# Patient Record
Sex: Female | Born: 1965
Health system: Southern US, Community
[De-identification: ages and names within clinical notes are randomized; demographics above are authoritative.]

## PROBLEM LIST (undated history)

## (undated) DIAGNOSIS — M51369 Other intervertebral disc degeneration, lumbar region without mention of lumbar back pain or lower extremity pain: Secondary | ICD-10-CM

## (undated) DIAGNOSIS — M5136 Other intervertebral disc degeneration, lumbar region: Secondary | ICD-10-CM

## (undated) DIAGNOSIS — Z800 Family history of malignant neoplasm of digestive organs: Secondary | ICD-10-CM

## (undated) DIAGNOSIS — Z8049 Family history of malignant neoplasm of other genital organs: Secondary | ICD-10-CM

## (undated) DIAGNOSIS — C20 Malignant neoplasm of rectum: Secondary | ICD-10-CM

## (undated) HISTORY — DX: Family history of malignant neoplasm of digestive organs: Z80.0

## (undated) HISTORY — PX: COLON SURGERY: SHX602

## (undated) HISTORY — DX: Other intervertebral disc degeneration, lumbar region: M51.36

## (undated) HISTORY — DX: Other intervertebral disc degeneration, lumbar region without mention of lumbar back pain or lower extremity pain: M51.369

## (undated) HISTORY — DX: Family history of malignant neoplasm of other genital organs: Z80.49

## (undated) HISTORY — DX: Malignant neoplasm of rectum: C20

---
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
## 2018-11-02 ENCOUNTER — Ambulatory Visit (INDEPENDENT_AMBULATORY_CARE_PROVIDER_SITE_OTHER): Payer: 59 | Admitting: Family Medicine

## 2018-11-02 ENCOUNTER — Other Ambulatory Visit: Payer: Self-pay

## 2018-11-02 ENCOUNTER — Encounter: Payer: Self-pay | Admitting: Family Medicine

## 2018-11-02 VITALS — BP 90/64 | HR 70 | Ht 68.0 in | Wt 133.2 lb

## 2018-11-02 DIAGNOSIS — R1032 Left lower quadrant pain: Secondary | ICD-10-CM | POA: Diagnosis not present

## 2018-11-02 DIAGNOSIS — K625 Hemorrhage of anus and rectum: Secondary | ICD-10-CM | POA: Diagnosis not present

## 2018-11-02 DIAGNOSIS — K921 Melena: Secondary | ICD-10-CM

## 2018-11-02 DIAGNOSIS — Z1211 Encounter for screening for malignant neoplasm of colon: Secondary | ICD-10-CM

## 2018-11-02 DIAGNOSIS — Z7689 Persons encountering health services in other specified circumstances: Secondary | ICD-10-CM

## 2018-11-02 DIAGNOSIS — R1084 Generalized abdominal pain: Secondary | ICD-10-CM

## 2018-11-02 LAB — HEMOCCULT GUIAC POC 1CARD (OFFICE): Fecal Occult Blood, POC: NEGATIVE

## 2018-11-02 LAB — POCT HEMOGLOBIN: Hemoglobin: 13.4 g/dL (ref 11–14.6)

## 2018-11-02 MED ORDER — POLYETHYLENE GLYCOL 3350 17 GM/SCOOP PO POWD: 17.0000 g | Freq: Two times a day (BID) | ORAL | 1 refills | Status: AC | PRN

## 2018-11-02 MED ORDER — HYDROCORTISONE (PERIANAL) 2.5 % EX CREA: 1.0000 "application " | TOPICAL_CREAM | Freq: Two times a day (BID) | CUTANEOUS | 0 refills | Status: DC

## 2018-11-02 MED FILL — PROCTOZONE-HC 2.5 % CREA: 2.5 | 20 days supply | Qty: 30 | Fill #0

## 2018-11-02 NOTE — Patient Instructions (Addendum)
Thank you for coming to see me today. It was a pleasure! Today we talked about:   For your bleeding and constipation I recommend that you follow-up with a gastroenterologist in order to have a colonoscopy.  You are overdue for screening.  I also recommend that you take MiraLAX by starting twice daily until you have regular soft bowel movements.  You should not be straining to go to the bathroom.  Please be sure to increase the amount of fiber in your diet and also be drinking plenty of water. You may try doing sits baths where you fill up the bath with a small amount of water in order to help relieve the pain.  You may use over-the-counter cream such as Preparation H.  I have sent in a steroid cream that can also help calm down the area that is called Anusol cream.  You may use this twice a day.   Please follow-up in 6 weeks if you have not been able to be seen by GI, otherwise follow-up for your Pap smear after 1 year.  If you have any questions or concerns, please do not hesitate to call the office at [phone].  Take Care,   Martinique Delcie Ruppert, DO  Hemorrhoids Hemorrhoids are swollen veins that may develop:  In the butt (rectum). These are called internal hemorrhoids.  Around the opening of the butt (anus). These are called external hemorrhoids. Hemorrhoids can cause pain, itching, or bleeding. Most of the time, they do not cause serious problems. They usually get better with diet changes, lifestyle changes, and other home treatments. What are the causes? This condition may be caused by:  Having trouble pooping (constipation).  Pushing hard (straining) to poop.  Watery poop (diarrhea).  Pregnancy.  Being very overweight (obese).  Sitting for long periods of time.  Heavy lifting or other activity that causes you to strain.  Anal sex.  Riding a bike for a long period of time. What are the signs or symptoms? Symptoms of this condition include:  Pain.  Itching or soreness  in the butt.  Bleeding from the butt.  Leaking poop.  Swelling in the area.  One or more lumps around the opening of your butt. How is this diagnosed? A doctor can often diagnose this condition by looking at the affected area. The doctor may also:  Do an exam that involves feeling the area with a gloved hand (digital rectal exam).  Examine the area inside your butt using a small tube (anoscope).  Order blood tests. This may be done if you have lost a lot of blood.  Have you get a test that involves looking inside the colon using a flexible tube with a camera on the end (sigmoidoscopy or colonoscopy). How is this treated? This condition can usually be treated at home. Your doctor may tell you to change what you eat, make lifestyle changes, or try home treatments. If these do not help, procedures can be done to remove the hemorrhoids or make them smaller. These may involve:  Placing rubber bands at the base of the hemorrhoids to cut off their blood supply.  Injecting medicine into the hemorrhoids to shrink them.  Shining a type of light energy onto the hemorrhoids to cause them to fall off.  Doing surgery to remove the hemorrhoids or cut off their blood supply. Follow these instructions at home: Eating and drinking  Eat foods that have a lot of fiber in them. These include whole grains, beans, nuts, fruits,  and vegetables.  Ask your doctor about taking products that have added fiber (fibersupplements).  Reduce the amount of fat in your diet. You can do this by: ? Eating low-fat dairy products. ? Eating less red meat. ? Avoiding processed foods.  Drink enough fluid to keep your pee (urine) pale yellow. Managing pain and swelling   Take a warm-water bath (sitz bath) for 20 minutes to ease pain. Do this 3-4 times a day. You may do this in a bathtub or using a portable sitz bath that fits over the toilet.  If told, put ice on the painful area. It may be helpful to use ice  between your warm baths. ? Put ice in a plastic bag. ? Place a towel between your skin and the bag. ? Leave the ice on for 20 minutes, 2-3 times a day. General instructions  Take over-the-counter and prescription medicines only as told by your doctor. ? Medicated creams and medicines may be used as told.  Exercise often. Ask your doctor how much and what kind of exercise is best for you.  Go to the bathroom when you have the urge to poop. Do not wait.  Avoid pushing too hard when you poop.  Keep your butt dry and clean. Use wet toilet paper or moist towelettes after pooping.  Do not sit on the toilet for a long time.  Keep all follow-up visits as told by your doctor. This is important. Contact a doctor if you:  Have pain and swelling that do not get better with treatment or medicine.  Have trouble pooping.  Cannot poop.  Have pain or swelling outside the area of the hemorrhoids. Get help right away if you have:  Bleeding that will not stop. Summary  Hemorrhoids are swollen veins in the butt or around the opening of the butt.  They can cause pain, itching, or bleeding.  Eat foods that have a lot of fiber in them. These include whole grains, beans, nuts, fruits, and vegetables.  Take a warm-water bath (sitz bath) for 20 minutes to ease pain. Do this 3-4 times a day. This information is not intended to replace advice given to you by your health care provider. Make sure you discuss any questions you have with your health care provider. Document Released: 10/08/2007 Document Revised: 01/06/2018 Document Reviewed: 05/20/2017 Elsevier Patient Education  2020 [employer].

## 2018-11-02 NOTE — Progress Notes (Signed)
Subjective:    Patient ID: Sharon Harris, female    DOB: 1965/12/24, 53 y.o.   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 Overall quite healthy and only went to the doctor when ill. No regular medications.   Constipation  BRBPR  Abdominal pain Constipation is a problem most of the time, no diarrhea. Once a week she will go all day and really has to strain, was on the toilet for 2 hours on Sunday. Having blood in stool. Bright red blood, no black stools. Symptoms have been present for ~8 weeks. She has never had a colonoscopy. Lower abdominal pain present with cramping worse after eating, nothing makes it better. Has not tried any medication. Has never had this problem before. No N/V.   Health Maintenance: Abnormal pap smear last January and had colpo and it was neg, needs pap smears every year Mammogram last year or year before.   Review of Systems  Constitutional: Positive for malaise/fatigue. Negative for chills, fever and weight loss.  HENT: Negative.   Eyes: Negative.   Respiratory: Negative.   Cardiovascular: Negative.   Gastrointestinal: Positive for abdominal pain, blood in stool and constipation. Negative for diarrhea, melena, nausea and vomiting.  Genitourinary: Negative.   Musculoskeletal: Negative.   Skin: Negative.   Neurological: Negative.   Endo/Heme/Allergies: Negative.   Psychiatric/Behavioral: Negative.     Patient Active Problem List   Diagnosis Date Noted  . BRBPR (bright red blood per rectum) 11/04/2018  . Abdominal pain 11/04/2018     Family History  Problem Relation Age of Onset  . Ovarian cancer Mother   . CVA Father   . Colon cancer Paternal Grandfather     Past Medical History:  Diagnosis Date  . Degenerative disc disease, lumbar    L4-L5 worst, DDD    Social Hx: Denies tobacco use, regular alcohol use, or illicit drug use.  Objective:  BP 90/64   Pulse 70   Ht 5\' 8"   (1.727 m)   Wt 133 lb 3.2 oz (60.4 kg)   SpO2 99%   BMI 20.25 kg/m  Vitals and nursing note reviewed  General: NAD, pleasant Head: Atraumatic Neck: Supple Cardiac: RRR, normal heart sounds, no murmurs Respiratory: CTAB, normal effort Abdomen: soft, diffusely tender on exam worse in RLQ, nondistended. Bowel sounds present. Rectum: multiple external, nonthrombosed hemorrhoids present, no fissure noted, no blood noted Extremities: no edema or cyanosis. WWP. MSK: normal gait Skin: warm and dry, no rashes noted Neuro: alert and oriented, no focal deficits Psych: Neatly groomed and appropriately dressed. Maintains good eye contact and is cooperative and attentive. Speech is normal volume and rate. Denies SI/ HI. Normal affect.  Assessment & Plan:    BRBPR (bright red blood per rectum) External hemorrhoids present on exam with negative FOBT and Hgb wnl. Counseled on sitz baths, given steroid cream for use at home, and to help with soft bm's. GI referral for screening colonoscopy  Abdominal pain Patient with diffuse abdominal pain, worse in RLQ. No gross abnormalities seen or felt. She has hx of appendectomy. No red flag sx and has hemorrhoids on exam which could explain BRBPR.  -Patient has never had colonoscopy before, will refer to GI for colonoscopy.  -Given tenderness noted, will also obtain CT abd with contrast to r/o diverticulitis as cause. Patient able to work through her pain but is uncomfortable on exam.  -Could also be due to constiaption  patient is describing, counseled on need to hydrate well with water and increase fiber in diet, will also start miralax and titrate according to BM's.   Health maintenance: colonscopy as above HEP C and HIV neg in office Patient filled out paperwork for records with mammo, and pap smear hx  Martinique Marti Mclane, [place] Resident, PGY-3

## 2018-11-03 LAB — CBC
Hematocrit: 40 % (ref 34.0–46.6)
Hemoglobin: 13.6 g/dL (ref 11.1–15.9)
MCH: 31.2 pg (ref 26.6–33.0)
MCHC: 34 g/dL (ref 31.5–35.7)
MCV: 92 fL (ref 79–97)
Platelets: 189 10*3/uL (ref 150–450)
RBC: 4.36 x10E6/uL (ref 3.77–5.28)
RDW: 12 % (ref 11.7–15.4)
WBC: 5.4 10*3/uL (ref 3.4–10.8)

## 2018-11-03 LAB — HIV ANTIBODY (ROUTINE TESTING W REFLEX): HIV Screen 4th Generation wRfx: NONREACTIVE

## 2018-11-03 LAB — COMPREHENSIVE METABOLIC PANEL
ALT: 26 IU/L (ref 0–32)
AST: 33 IU/L (ref 0–40)
Albumin/Globulin Ratio: 2.4 — ABNORMAL HIGH (ref 1.2–2.2)
Albumin: 4.7 g/dL (ref 3.8–4.9)
Alkaline Phosphatase: 80 IU/L (ref 39–117)
BUN/Creatinine Ratio: 15 (ref 9–23)
BUN: 15 mg/dL (ref 6–24)
Bilirubin Total: 0.3 mg/dL (ref 0.0–1.2)
CO2: 25 mmol/L (ref 20–29)
Calcium: 9.7 mg/dL (ref 8.7–10.2)
Chloride: 103 mmol/L (ref 96–106)
Creatinine, Ser: 0.99 mg/dL (ref 0.57–1.00)
GFR calc Af Amer: 74 mL/min/{1.73_m2} (ref >59)
GFR calc non Af Amer: 64 mL/min/{1.73_m2} (ref >59)
Globulin, Total: 2 g/dL (ref 1.5–4.5)
Glucose: 101 mg/dL — ABNORMAL HIGH (ref 65–99)
Potassium: 4 mmol/L (ref 3.5–5.2)
Sodium: 141 mmol/L (ref 134–144)
Total Protein: 6.7 g/dL (ref 6.0–8.5)

## 2018-11-03 LAB — HEPATITIS C ANTIBODY: Hep C Virus Ab: <0.1 s/co ratio (ref 0.0–0.9)

## 2018-11-04 ENCOUNTER — Encounter: Payer: Self-pay | Admitting: Family Medicine

## 2018-11-04 DIAGNOSIS — R109 Unspecified abdominal pain: Secondary | ICD-10-CM | POA: Insufficient documentation

## 2018-11-04 DIAGNOSIS — K625 Hemorrhage of anus and rectum: Secondary | ICD-10-CM | POA: Insufficient documentation

## 2018-11-04 NOTE — Assessment & Plan Note (Addendum)
External hemorrhoids present on exam with negative FOBT and Hgb wnl. Counseled on sitz baths, given steroid cream for use at home, and to help with soft bm's. GI referral for screening colonoscopy

## 2018-11-04 NOTE — Assessment & Plan Note (Addendum)
Patient with diffuse abdominal pain, worse in RLQ. No gross abnormalities seen or felt. She has hx of appendectomy. No red flag sx and has hemorrhoids on exam which could explain BRBPR.  -Patient has never had colonoscopy before, will refer to GI for colonoscopy.  -Given tenderness noted, will also obtain CT abd with contrast to r/o diverticulitis as cause. Patient able to work through her pain but is uncomfortable on exam.  -Could also be due to constiaption patient is describing, counseled on need to hydrate well with water and increase fiber in diet, will also start miralax and titrate according to BM's.

## 2018-11-09 ENCOUNTER — Ambulatory Visit (HOSPITAL_COMMUNITY)
Admission: RE | Admit: 2018-11-09 | Discharge: 2018-11-09 | Disposition: A | Discharge status: Home / Self Care | Payer: 59 | Source: Ambulatory Visit | Attending: Family Medicine | Admitting: Family Medicine

## 2018-11-09 ENCOUNTER — Other Ambulatory Visit: Payer: Self-pay

## 2018-11-09 DIAGNOSIS — K7689 Other specified diseases of liver: Secondary | ICD-10-CM | POA: Diagnosis not present

## 2018-11-09 DIAGNOSIS — R1032 Left lower quadrant pain: Secondary | ICD-10-CM | POA: Insufficient documentation

## 2018-11-09 DIAGNOSIS — R109 Unspecified abdominal pain: Secondary | ICD-10-CM | POA: Diagnosis not present

## 2018-11-09 MED ORDER — IOHEXOL 300 MG/ML  SOLN
100.0000 mL | Freq: Once | INTRAMUSCULAR | Status: AC | PRN
  Administered 2018-11-09: 16:00:00 100 mL via INTRAVENOUS

## 2018-11-09 NOTE — Telephone Encounter (Signed)
Health at Work  form dropped off for at front desk for completion.  Verified that patient section of form has been completed.  Last DOS/WCC with PCP was10/21/20  Placed form in team folder to be completed by clinical staff.  Sharon Harris

## 2018-11-18 ENCOUNTER — Telehealth: Payer: Self-pay

## 2018-11-18 NOTE — Telephone Encounter (Signed)
Pt returning a call to Dr. Enid Derry. Pt thinks she may be calling about the CT scan. Please give pt a call on [phone]. Ottis Stain, CMA

## 2018-11-21 ENCOUNTER — Telehealth: Payer: Self-pay

## 2018-11-21 NOTE — Telephone Encounter (Signed)
Patient calls nurse line returning PCP phone call. I informed her of recorded note from PCP. Patient stated, "what I looked at was not mostly normal." I informed patient I would have PCP call her to discuss further.

## 2018-11-22 NOTE — Telephone Encounter (Signed)
Attempted to call patient at [phone] [phone] without answer. Left another voicemail explaining to patient that the CT was normal but did show an incidental finding on her liver that they would like to follow-up with further imaging with MRI.  Patient instructed that she could call the office in order for them to schedule the MRI at her convenience.  Also let her know that I will be in clinic today and if she has any questions that are not answered by the voicemail or this note that she may have them try to get me to speak with her over the phone while I am in clinic.  Martinique Akbar Sacra, DO PGY-3, [facility]

## 2018-11-23 NOTE — Telephone Encounter (Signed)
Patient calls nurse line requesting.to speak with PCP. Patient is sorry she keeps missing PCP phone calls.

## 2018-11-28 ENCOUNTER — Other Ambulatory Visit: Payer: Self-pay | Admitting: Family Medicine

## 2018-11-28 DIAGNOSIS — K769 Liver disease, unspecified: Secondary | ICD-10-CM

## 2018-11-28 NOTE — Addendum Note (Signed)
Addended by: Tedd Cottrill, Martinique J on: 11/28/2018 08:30 PM   Modules accepted: Orders

## 2018-11-28 NOTE — Telephone Encounter (Signed)
Order has been placed. I will attempt to reach out to patient tomorrow at an earlier time, but I am now on nights so it may be more difficult to reach me. Thank you

## 2018-11-28 NOTE — Telephone Encounter (Signed)
Pt LVM on nurse line again. Pt calling to speak with Dr. Enid Derry and schedule MRI. I do not see an order for the MRI. Please advise when order has been placed. Pt can be reached at [phone]. Ottis Stain, CMA\

## 2018-11-28 NOTE — Telephone Encounter (Signed)
Returned call to pt. I informed her that I sent a message to Dr. Enid Derry and someone will give her a call when the order has been put in. Pt would like the order to be sent to [facility] instead of the hospital. I explained that GI would be calling her to ask some questions and she would be able to ask them questions as well. Pt can be reached at [phone]. Ottis Stain, CMA

## 2018-11-29 NOTE — Telephone Encounter (Signed)
Pt informed.  Manveer Gomes, CMA  

## 2018-12-05 ENCOUNTER — Encounter: Payer: Self-pay | Admitting: Family Medicine

## 2018-12-06 MED FILL — PROCTOZONE-HC 2.5 % CREA: 2.5 | 20 days supply | Qty: 30 | Fill #0

## 2018-12-26 ENCOUNTER — Other Ambulatory Visit: Payer: 59

## 2019-01-16 ENCOUNTER — Ambulatory Visit (INDEPENDENT_AMBULATORY_CARE_PROVIDER_SITE_OTHER): Payer: 59 | Admitting: Physician Assistant

## 2019-01-16 ENCOUNTER — Other Ambulatory Visit (INDEPENDENT_AMBULATORY_CARE_PROVIDER_SITE_OTHER): Payer: 59

## 2019-01-16 ENCOUNTER — Other Ambulatory Visit: Payer: 59

## 2019-01-16 ENCOUNTER — Ambulatory Visit (INDEPENDENT_AMBULATORY_CARE_PROVIDER_SITE_OTHER): Payer: 59

## 2019-01-16 ENCOUNTER — Encounter: Payer: Self-pay | Admitting: Physician Assistant

## 2019-01-16 ENCOUNTER — Other Ambulatory Visit: Payer: Self-pay | Admitting: Family Medicine

## 2019-01-16 VITALS — BP 110/74 | Temp 97.1°F | Ht 68.0 in | Wt 132.1 lb

## 2019-01-16 DIAGNOSIS — Z1159 Encounter for screening for other viral diseases: Secondary | ICD-10-CM

## 2019-01-16 DIAGNOSIS — R933 Abnormal findings on diagnostic imaging of other parts of digestive tract: Secondary | ICD-10-CM

## 2019-01-16 DIAGNOSIS — R194 Change in bowel habit: Secondary | ICD-10-CM | POA: Diagnosis not present

## 2019-01-16 DIAGNOSIS — R1906 Epigastric swelling, mass or lump: Secondary | ICD-10-CM | POA: Diagnosis not present

## 2019-01-16 DIAGNOSIS — R1013 Epigastric pain: Secondary | ICD-10-CM | POA: Diagnosis not present

## 2019-01-16 DIAGNOSIS — R110 Nausea: Secondary | ICD-10-CM

## 2019-01-16 DIAGNOSIS — K625 Hemorrhage of anus and rectum: Secondary | ICD-10-CM

## 2019-01-16 LAB — CBC WITH DIFFERENTIAL/PLATELET
Basophils Absolute: 0.1 10*3/uL (ref 0.0–0.1)
Basophils Relative: 1.3 % (ref 0.0–3.0)
Eosinophils Absolute: 0.2 10*3/uL (ref 0.0–0.7)
Eosinophils Relative: 3.3 % (ref 0.0–5.0)
HCT: 39.2 % (ref 36.0–46.0)
Hemoglobin: 13.4 g/dL (ref 12.0–15.0)
Lymphocytes Relative: 25.1 % (ref 12.0–46.0)
Lymphs Abs: 1.8 10*3/uL (ref 0.7–4.0)
MCHC: 34 g/dL (ref 30.0–36.0)
MCV: 91.1 fl (ref 78.0–100.0)
Monocytes Absolute: 0.6 10*3/uL (ref 0.1–1.0)
Monocytes Relative: 8.7 % (ref 3.0–12.0)
Neutro Abs: 4.4 10*3/uL (ref 1.4–7.7)
Neutrophils Relative %: 61.6 % (ref 43.0–77.0)
Platelets: 254 10*3/uL (ref 150.0–400.0)
RBC: 4.3 Mil/uL (ref 3.87–5.11)
RDW: 12.5 % (ref 11.5–15.5)
WBC: 7.1 10*3/uL (ref 4.0–10.5)

## 2019-01-16 LAB — COMPREHENSIVE METABOLIC PANEL
ALT: 54 U/L — ABNORMAL HIGH (ref 0–35)
AST: 47 U/L — ABNORMAL HIGH (ref 0–37)
Albumin: 4.2 g/dL (ref 3.5–5.2)
Alkaline Phosphatase: 197 U/L — ABNORMAL HIGH (ref 39–117)
BUN: 10 mg/dL (ref 6–23)
CO2: 30 mEq/L (ref 19–32)
Calcium: 9.5 mg/dL (ref 8.4–10.5)
Chloride: 101 mEq/L (ref 96–112)
Creatinine, Ser: 0.7 mg/dL (ref 0.40–1.20)
GFR: 86.8 mL/min (ref >60.00)
Glucose, Bld: 116 mg/dL — ABNORMAL HIGH (ref 70–99)
Potassium: 4.2 mEq/L (ref 3.5–5.1)
Sodium: 138 mEq/L (ref 135–145)
Total Bilirubin: 0.3 mg/dL (ref 0.2–1.2)
Total Protein: 6.7 g/dL (ref 6.0–8.3)

## 2019-01-16 MED ORDER — NA SULFATE-K SULFATE-MG SULF 17.5-3.13-1.6 GM/177ML PO SOLN: ORAL | 0 refills | Status: DC

## 2019-01-16 MED FILL — SUPREP BOWEL PREP KIT: 17.5-3.13-1 | 1 days supply | Qty: 354 | Fill #0

## 2019-01-16 NOTE — Progress Notes (Signed)
Subjective:    Patient ID: Sharon Harris, female    DOB: 1965-02-25, 54 y.o.   MRN: [id number]  HPI Charm is a pleasant 54 year old white female, new to GI today referred by Dr. Martinique Shirley, DO/[facility] for abnormal CT of the abdomen and complaints of abdominal pain, change in bowel habits and intermittent hematochezia. Patient has not had any prior GI evaluation.  She says her symptoms started this fall around October when she began noting lower abdominal discomfort and intermittent red blood in her stool.  She was seen by family practice.  Scheduled for CT of the abdomen and pelvis which was done on 11/09/2018. She is frustrated because she had a hard time communicating with the family practice office and getting a referral to GI. CT revealed a 3 mm nodular opacity in the right middle lobe near the pleural surface, multiple mass lesions throughout the liver ranging in size from 7 mm to as large as 4.6 x 4.2 cm.  Suspect multiple hemangiomas, though noted that these lesions do not have classic hemangioma appearance ,gallbladder wall not thickened no biliary ductal dilation.  There is diffuse stool through the colon no appreciable bowel wall thickening or mesenteric thickening.  Nonemergent MRI was recommended  Most recent labs 11/01/2018-WBC 5.4, hemoglobin 13.6, stool was documented Hemoccult negative LFTs within normal limits, albumin 2.4  Patient says she has been eating much smaller meals over the past month or so and has lost about 6 pounds.  She has had some nausea after eating but no vomiting.  She has increasing abdominal pain postprandially.  She states that she can feel a rounded mass in the upper abdomen which has gotten larger over the past several weeks..  Overall she says she has not been feeling well has had ongoing fatigue and some exertional dyspnea.      Review of Systems Pertinent positive and negative review of systems were noted in the above HPI section.   All other review of systems was otherwise negative.  Outpatient Encounter Medications as of 01/16/2019  Medication Sig  . hydrocortisone (ANUSOL-HC) 2.5 % rectal cream Place 1 application rectally 2 (two) times daily.  Marland Kitchen Lysine 500 MG TABS Take 1 tablet by mouth daily.  . Multiple Vitamin (MULTIVITAMIN WITH MINERALS) TABS tablet Take 1 tablet by mouth daily.  . polyethylene glycol powder (GLYCOLAX/MIRALAX) 17 GM/SCOOP powder Take 17 g by mouth 2 (two) times daily as needed.  . Na Sulfate-K Sulfate-Mg Sulf 17.5-3.13-1.6 GM/177ML SOLN Suprep-Use as directed   No facility-administered encounter medications on file as of 01/16/2019.   No Known Allergies Patient Active Problem List   Diagnosis Date Noted  . BRBPR (bright red blood per rectum) 11/04/2018  . Abdominal pain 11/04/2018   Social History   Socioeconomic History  . Marital status: Married    Spouse name: Not on file  . Number of children: Not on file  . Years of education: Not on file  . Highest education level: Not on file  Occupational History  . Not on file  Tobacco Use  . Smoking status: Never Smoker  . Smokeless tobacco: Never Used  Substance and Sexual Activity  . Alcohol use: Yes    Comment: mixed drink, occasionally   . Drug use: Never  . Sexual activity: Not on file  Other Topics Concern  . Not on file  Social History Narrative  . Not on file   Social Determinants of Health   Financial Resource Strain:   .  Difficulty of Paying Living Expenses: Not on file  Food Insecurity:   . Worried About Charity fundraiser in the Last Year: Not on file  . Ran Out of Food in the Last Year: Not on file  Transportation Needs:   . Lack of Transportation (Medical): Not on file  . Lack of Transportation (Non-Medical): Not on file  Physical Activity:   . Days of Exercise per Week: Not on file  . Minutes of Exercise per Session: Not on file  Stress:   . Feeling of Stress : Not on file  Social Connections:   . Frequency  of Communication with Friends and Family: Not on file  . Frequency of Social Gatherings with Friends and Family: Not on file  . Attends Religious Services: Not on file  . Active Member of Clubs or Organizations: Not on file  . Attends Archivist Meetings: Not on file  . Marital Status: Not on file  Intimate Partner Violence:   . Fear of Current or Ex-Partner: Not on file  . Emotionally Abused: Not on file  . Physically Abused: Not on file  . Sexually Abused: Not on file    Ms. Hawkey's family history includes CVA in her father; Colon cancer in her paternal grandfather; Ovarian cancer in her mother.      Objective:    Vitals:   01/16/19 1454  BP: 110/74  Temp: (!) 97.1 F (36.2 C)  SpO2: 98%    Physical Exam Well-developed well-nourished thin white female in no acute distress.  Height, Weight, 132 BMI 20.0  HEENT; nontraumatic normocephalic, EOMI, PE RR LA, sclera anicteric. Oropharynx; not examined/mask/Covid Neck; supple, no JVD Cardiovascular; regular rate and rhythm with S1-S2, no murmur rub or gallop Pulmonary; Clear bilaterally Abdomen; soft, she is tender across the lower abdomen, no palpable mass or guarding, nondistended.  She is tender in the epigastrium and right upper quadrant and there is a palpable mass in the epigastrium rounded and somewhat nodular consistent with left lobe of liver. Rectal; not done today Skin; benign exam, no jaundice rash or appreciable lesions Extremities; no clubbing cyanosis or edema skin warm and dry Neuro/Psych; alert and oriented x4, grossly nonfocal mood and affect appropriate      Assessment & Plan:   #87 54 year old white female with 25-monthhistory of symptoms initially with lower abdominal discomfort, associated with intermittent small-volume hematochezia, now progressed to frequent small-volume stools. 4 to 6-week history of epigastric pain, decrease in appetite, weight loss postprandial abdominal pain, nausea, and  on exam with palpable mass-effect in the epigastrium.  Patient had abnormal CT imaging on 11/09/2018 with multiple liver lesions noted felt to be most likely hemangiomas though not displaying all the typical imaging features of hemangiomas, and MRI recommended. No bowel abnormality noted on CT.  #2  3 mm lung nodule right lower lobe  Etiology of symptoms and imaging findings not clear, though clearly worrisome for underlying malignant process.  Rule out possible colonic neoplasm with hepatic metastases.  Rule out atypical hemangiomas.  Plan; CBC with differential, c-Met, Patient will be scheduled for Colonoscopy with Dr. CBryan Lemma  Procedure was discussed in detail with the patient including indications risks and benefits and she is agreeable to proceed.  Attempted to get her MRI of the abdomen and pelvis which is scheduled for January 13 moved to a sooner date this week, and also had requested to add MRI of the chest.  We were unable to get the MRI rescheduled to a  sooner date so at this point we will leave her scheduled for MRI of the abdomen and pelvis on January 13.  Further recommendations pending results of above.  Amy Genia Harold PA-C 01/16/2019   Cc: Leeanne Rio, MD

## 2019-01-16 NOTE — Patient Instructions (Signed)
If you are age 56 or older, your body mass index should be between 23-30. Your Body mass index is 20.09 kg/m. If this is out of the aforementioned range listed, please consider follow up with your Primary Care Provider.  If you are age 28 or younger, your body mass index should be between 19-25. Your Body mass index is 20.09 kg/m. If this is out of the aformentioned range listed, please consider follow up with your Primary Care Provider.   You have been scheduled for a colonoscopy. Please follow written instructions given to you at your visit today.  Please pick up your prep supplies at the pharmacy within the next 1-3 days. If you use inhalers (even only as needed), please bring them with you on the day of your procedure. Your physician has requested that you go to www.startemmi.com and enter the access code given to you at your visit today. This web site gives a general overview about your procedure. However, you should still follow specific instructions given to you by our office regarding your preparation for the procedure.  We have sent the following medications to your pharmacy for you to pick up at your convenience: Suprep Continue Anusol HC suppositories at bedtime daily. Try over-the -counter Recticare for hemorrhoids.  Your provider has requested that you go to the basement level for lab work before leaving today. Press "B" on the elevator. The lab is located at the first door on the left as you exit the elevator.   Thank you for choosing me and [facility].   Amy Esterwood, PA-C   Due to recent changes in healthcare laws, you may see the results of your imaging and laboratory studies on MyChart before your provider has had a chance to review them.  We understand that in some cases there may be results that are confusing or concerning to you. Not all laboratory results come back in the same time frame and the provider may be waiting for multiple results in order to  interpret others.  Please give [place] 48 hours in order for your provider to thoroughly review all the results before contacting the office for clarification of your results.

## 2019-01-17 NOTE — Progress Notes (Signed)
Agree with the assessment and plan as outlined by Nicoletta Ba, PA-C. Plan for expedited colonoscopy.  Given epigastric pain, decreased appetite, weight loss, nausea, recommend also scheduling for EGD, which can be done on the same day.  Agree with imaging studies as ordered.  Michae Grimley, DO, [facility]

## 2019-01-18 ENCOUNTER — Ambulatory Visit (INDEPENDENT_AMBULATORY_CARE_PROVIDER_SITE_OTHER): Payer: 59

## 2019-01-18 DIAGNOSIS — Z1159 Encounter for screening for other viral diseases: Secondary | ICD-10-CM

## 2019-01-19 LAB — SARS CORONAVIRUS 2 (TAT 6-24 HRS): SARS Coronavirus 2: NEGATIVE

## 2019-01-20 ENCOUNTER — Ambulatory Visit (AMBULATORY_SURGERY_CENTER): Payer: 59 | Admitting: Gastroenterology

## 2019-01-20 ENCOUNTER — Encounter: Payer: Self-pay | Admitting: Gastroenterology

## 2019-01-20 ENCOUNTER — Other Ambulatory Visit: Payer: Self-pay

## 2019-01-20 VITALS — BP 113/73 | HR 81 | Temp 98.0°F | Resp 14 | Ht 68.0 in | Wt 132.0 lb

## 2019-01-20 DIAGNOSIS — R110 Nausea: Secondary | ICD-10-CM | POA: Diagnosis not present

## 2019-01-20 DIAGNOSIS — R634 Abnormal weight loss: Secondary | ICD-10-CM

## 2019-01-20 DIAGNOSIS — R1013 Epigastric pain: Secondary | ICD-10-CM

## 2019-01-20 DIAGNOSIS — K625 Hemorrhage of anus and rectum: Secondary | ICD-10-CM

## 2019-01-20 DIAGNOSIS — R194 Change in bowel habit: Secondary | ICD-10-CM

## 2019-01-20 DIAGNOSIS — K2980 Duodenitis without bleeding: Secondary | ICD-10-CM | POA: Diagnosis not present

## 2019-01-20 DIAGNOSIS — K2950 Unspecified chronic gastritis without bleeding: Secondary | ICD-10-CM

## 2019-01-20 DIAGNOSIS — C20 Malignant neoplasm of rectum: Secondary | ICD-10-CM | POA: Diagnosis not present

## 2019-01-20 DIAGNOSIS — R1030 Lower abdominal pain, unspecified: Secondary | ICD-10-CM

## 2019-01-20 DIAGNOSIS — K642 Third degree hemorrhoids: Secondary | ICD-10-CM

## 2019-01-20 DIAGNOSIS — K6289 Other specified diseases of anus and rectum: Secondary | ICD-10-CM

## 2019-01-20 MED ORDER — SODIUM CHLORIDE 0.9 % IV SOLN: 500.0000 mL | Freq: Once | INTRAVENOUS | Status: DC

## 2019-01-20 NOTE — Progress Notes (Signed)
Called to room to assist during endoscopic procedure.  Patient ID and intended procedure confirmed with present staff. Received instructions for my participation in the procedure from the performing physician.  

## 2019-01-20 NOTE — Progress Notes (Signed)
Report to PACU, RN, vss, BBS= Clear.  

## 2019-01-20 NOTE — Op Note (Signed)
[facility] Patient Name: Sharon Harris Procedure Date: 01/20/2019 1:43 PM MRN: [id number] Endoscopist: Gerrit Heck , MD Age: 54 Referring MD:  Date of Birth: Jun 21, 1965 Gender: Female Account #: [id number] Procedure:                Colonoscopy Indications:              Lower abdominal pain, Hematochezia, Abnormal CT of                            the GI tract, Change in bowel habits, Diarrhea                           54 yo female presents with multiple GI symptoms, to                            include nausea without emesis, 6# weight loss,                            lower abdominal pain, epigastric pain, and change                            in bowel habits (loose stools) along with                            hematochezia and fatigue. Sxs present since                            approximately October 2020. CT completed in 10/2018                            demonstrated a 3 mm nodular opacity in the right                            middle lobe near the pleural surface, multiple                            lesions throughout the liver ranging in size from 7                            mm to as large as 4.6 x 4.2 cm. MRI ordered and                            scheduled for next week. Normal BC last week with                            CMP notable for AST/ALT 47/54, ALP 197 (all                            previously normal in 10/2018). No previous                            colonoscopy. Medicines:  Monitored Anesthesia Care Procedure:                Pre-Anesthesia Assessment:                           - Prior to the procedure, a History and Physical                            was performed, and patient medications and                            allergies were reviewed. The patient's tolerance of                            previous anesthesia was also reviewed. The risks                            and benefits of the procedure and the sedation                             options and risks were discussed with the patient.                            All questions were answered, and informed consent                            was obtained. Prior Anticoagulants: The patient has                            taken no previous anticoagulant or antiplatelet                            agents. ASA Grade Assessment: II - A patient with                            mild systemic disease. After reviewing the risks                            and benefits, the patient was deemed in                            satisfactory condition to undergo the procedure.                           After obtaining informed consent, the colonoscope                            was passed under direct vision. Throughout the                            procedure, the patient's blood pressure, pulse, and                            oxygen saturations were monitored continuously. The  Colonoscope was introduced through the anus and                            advanced to the the rectum. The colonoscopy was                            technically difficult and complex due to a                            partially obstructing mass. The patient tolerated                            the procedure well. The quality of the bowel                            preparation was adequate. The rectum was                            photographed. Scope In: 2:08:19 PM Scope Out: 2:18:31 PM Total Procedure Duration: 0 hours 10 minutes 12 seconds  Findings:                 Hemorrhoids were found on perianal exam.                           A frond-like/villous, fungating and ulcerated                            completely obstructing large mass was found in the                            proximal rectum, located approximately 10 cm from                            the anal verge. The mass was circumferential and                            non-traversable. Oozing was present. This was                             extensively biopsied with a cold forceps for                            histology. Path was sent as rush. Estimated blood                            loss was minimal.                           Non-bleeding internal hemorrhoids were found during                            retroflexion. The hemorrhoids were large. Complications:            No immediate complications. Estimated Blood Loss:  Estimated blood loss was minimal. Impression:               - Hemorrhoids found on perianal exam.                           - Malignant, obstructing tumor in the proximal                            rectum, located approximately 10 cm from the anal                            verge. Biopsied. Given location in the rectum,                            tattoo for localization was not placed.                           - Non-bleeding internal hemorrhoids. Recommendation:           - Patient has a contact number available for                            emergencies. The signs and symptoms of potential                            delayed complications were discussed with the                            patient. Return to normal activities tomorrow.                            Written discharge instructions were provided to the                            patient.                           - Resume previous diet today.                           - Continue present medications.                           - Await pathology results.                           - Refer to an oncologist at appointment to be                            scheduled.                           - Refer to a colo-rectal surgeon at appointment to                            be scheduled.                           -  Return to GI clinic in 3 weeks.                           - Check CEA at the next available appointment. Gerrit Heck, MD 01/20/2019 2:37:08 PM

## 2019-01-20 NOTE — Progress Notes (Signed)
[facility]  [facility]  [facility]  Pt's states no medical or surgical changes since previsit or office visit. Admitting RN reviewed.

## 2019-01-20 NOTE — Patient Instructions (Signed)
Handout provided on hemorrhoids.  See "Recommendations" on Colonoscopy report for details. Dr. Vivia Ewing office will schedule you a follow-up appointment for 3 weeks.  YOU HAD AN ENDOSCOPIC PROCEDURE TODAY AT [facility]:   Refer to the procedure report that was given to you for any specific questions about what was found during the examination.  If the procedure report does not answer your questions, please call your gastroenterologist to clarify.  If you requested that your care partner not be given the details of your procedure findings, then the procedure report has been included in a sealed envelope for you to review at your convenience later.  YOU SHOULD EXPECT: Some feelings of bloating in the abdomen. Passage of more gas than usual.  Walking can help get rid of the air that was put into your GI tract during the procedure and reduce the bloating. If you had a lower endoscopy (such as a colonoscopy or flexible sigmoidoscopy) you may notice spotting of blood in your stool or on the toilet paper. If you underwent a bowel prep for your procedure, you may not have a normal bowel movement for a few days.  Please Note:  You might notice some irritation and congestion in your nose or some drainage.  This is from the oxygen used during your procedure.  There is no need for concern and it should clear up in a day or so.  SYMPTOMS TO REPORT IMMEDIATELY:   Following lower endoscopy (colonoscopy or flexible sigmoidoscopy):  Excessive amounts of blood in the stool  Significant tenderness or worsening of abdominal pains  Swelling of the abdomen that is new, acute  Fever of 100F or higher   Following upper endoscopy (EGD)  Vomiting of blood or coffee ground material  New chest pain or pain under the shoulder blades  Painful or persistently difficult swallowing  New shortness of breath  Fever of 100F or higher  Black, tarry-looking stools  For urgent or emergent issues, a  gastroenterologist can be reached at any hour by calling [phone].   DIET:  We do recommend a small meal at first, but then you may proceed to your regular diet.  Drink plenty of fluids but you should avoid alcoholic beverages for 24 hours.  ACTIVITY:  You should plan to take it easy for the rest of today and you should NOT DRIVE or use heavy machinery until tomorrow (because of the sedation medicines used during the test).    FOLLOW UP: Our staff will call the number listed on your records 48-72 hours following your procedure to check on you and address any questions or concerns that you may have regarding the information given to you following your procedure. If we do not reach you, we will leave a message.  We will attempt to reach you two times.  During this call, we will ask if you have developed any symptoms of COVID 19. If you develop any symptoms (ie: fever, flu-like symptoms, shortness of breath, cough etc.) before then, please call [phone].  If you test positive for Covid 19 in the 2 weeks post procedure, please call and report this information to [place].    If any biopsies were taken you will be contacted by phone or by letter within the next 1-3 weeks.  Please call us at [phone] if you have not heard about the biopsies in 3 weeks.    SIGNATURES/CONFIDENTIALITY: You and/or your care partner have signed paperwork which will be entered into your  electronic medical record.  These signatures attest to the fact that that the information above on your After Visit Summary has been reviewed and is understood.  Full responsibility of the confidentiality of this discharge information lies with you and/or your care-partner.

## 2019-01-20 NOTE — Op Note (Signed)
[facility] Patient Name: Sharon Harris Procedure Date: 01/20/2019 1:43 PM MRN: [id number] Endoscopist: Gerrit Heck , MD Age: 55 Referring MD:  Date of Birth: 08/09/1965 Gender: Female Account #: [id number] Procedure:                Upper GI endoscopy Indications:              Epigastric abdominal pain, Lower abdominal pain,                            Hematochezia, Abnormal CT of the GI tract,                            Diarrhea, Nausea, Weight loss                           54 yo female presents with multiple GI symptoms, to                            include nausea without emesis, 6# weight loss,                            lower abdominal pain, epigastric pain, and change                            in bowel habits (loose stools) along with                            hematochezia and fatigue. Sxs present since                            approximately October 2020. CT completed in 10/2018                            demonstrated a 3 mm nodular opacity in the right                            middle lobe near the pleural surface, multiple                            lesions throughout the liver ranging in size from 7                            mm to as large as 4.6 x 4.2 cm. MRI ordered and                            scheduled for next week. Normal BC last week with                            CMP notable for AST/ALT 47/54, ALP 197 (all                            previously normal in 10/2018). Medicines:  Monitored Anesthesia Care Procedure:                Pre-Anesthesia Assessment:                           - Prior to the procedure, a History and Physical                            was performed, and patient medications and                            allergies were reviewed. The patient's tolerance of                            previous anesthesia was also reviewed. The risks                            and benefits of the procedure and the sedation                             options and risks were discussed with the patient.                            All questions were answered, and informed consent                            was obtained. Prior Anticoagulants: The patient has                            taken no previous anticoagulant or antiplatelet                            agents. ASA Grade Assessment: II - A patient with                            mild systemic disease. After reviewing the risks                            and benefits, the patient was deemed in                            satisfactory condition to undergo the procedure.                           After obtaining informed consent, the endoscope was                            passed under direct vision. Throughout the                            procedure, the patient's blood pressure, pulse, and                            oxygen saturations were monitored continuously. The  Endoscope was introduced through the mouth, and                            advanced to the second part of duodenum. The upper                            GI endoscopy was accomplished without difficulty.                            The patient tolerated the procedure well. Scope In: Scope Out: Findings:                 The examined esophagus was normal.                           The entire examined stomach was normal. Biopsies                            were taken with a cold forceps for Helicobacter                            pylori testing. Estimated blood loss was minimal.                           The duodenal bulb, first portion of the duodenum                            and second portion of the duodenum were normal.                            Biopsies were taken with a cold forceps for                            histology. Estimated blood loss was minimal. Complications:            No immediate complications. Estimated Blood Loss:     Estimated blood loss was  minimal. Impression:               - Normal esophagus.                           - Normal stomach. Biopsied.                           - Normal duodenal bulb, first portion of the                            duodenum and second portion of the duodenum.                            Biopsied. Recommendation:           - Patient has a contact number available for                            emergencies. The signs and symptoms of potential  delayed complications were discussed with the                            patient. Return to normal activities tomorrow.                            Written discharge instructions were provided to the                            patient.                           - Resume previous diet today.                           - Continue present medications.                           - Await pathology results.                           - Perform a colonoscopy today. Gerrit Heck, MD 01/20/2019 2:29:56 PM

## 2019-01-23 ENCOUNTER — Other Ambulatory Visit: Payer: Self-pay | Admitting: Gastroenterology

## 2019-01-23 DIAGNOSIS — K6289 Other specified diseases of anus and rectum: Secondary | ICD-10-CM

## 2019-01-23 DIAGNOSIS — D128 Benign neoplasm of rectum: Secondary | ICD-10-CM

## 2019-01-23 NOTE — Progress Notes (Signed)
chst

## 2019-01-24 ENCOUNTER — Other Ambulatory Visit: Payer: Self-pay | Admitting: Hematology

## 2019-01-24 ENCOUNTER — Encounter: Payer: Self-pay | Admitting: *Deleted

## 2019-01-24 DIAGNOSIS — C20 Malignant neoplasm of rectum: Secondary | ICD-10-CM | POA: Insufficient documentation

## 2019-01-24 NOTE — Progress Notes (Signed)
Reached out to [employer] to introduce myself as the office RN Navigator and explain our new patient process. Reviewed the reason for their referral and scheduled their new patient appointment along with labs. Provided address and directions to the office including call back phone number. Reviewed with patient any concerns they may have or any possible barriers to attending their appointment.   Informed patient about my role as a navigator and that I will meet with them prior to their New Patient appointment and more fully discuss what services I can provide. At this time patient has no further questions or needs.   Patient has lab appointment tomorrow with Dr Bryan Lemma and wanted to know if everything can be drawn at that time. Called that office and they cannot draw any labs outside of their physicians orders. We can drawn all albs at our appointment. So patient will come for labs as scheduled with our lab and we will draw all labs for [facility] and [facility]. Patient aware of this plan.

## 2019-01-24 NOTE — Progress Notes (Signed)
[facility] NOTE  Patient Care Team: Shirley, Martinique, DO as PCP - General (Family Medicine) Tish Men, MD as Medical Oncologist (Hematology) Cordelia Poche, RN as Oncology Nurse Navigator  HEME/ONC OVERVIEW: 1. Rectal adenocarcinoma, stage TBD  -10/2018: multiple liver masses on CT abdomen, largest 4.6x4.2cm, etiology unclear; no definite primary malignancy -01/2019:   A large rectal mas obstructing the proximal rectum, ~10cm from the anal verge, circumferential; bx'ed, adenocarcinoma  Numerous masses involving all segments of liver, largest ~8cm  ASSESSMENT & PLAN:   Rectal adenocarcinoma, stage TBD  -I reviewed the patient's records in detail, including PCP and GI clinic notes, lab studies and imaging results  -I also independently reviewed the radiologic images of CT abdomen/pelvis in late 10/2018 and MRI in 01/2019, and agree with the findings documented -In summary, patient established PCP care in 10/2018, and reported symptoms of lower abdominal discomfort, associated with intermittent small volume hematochezia.  CT abdomen/pelvis at that time showed multiple liver masses, the largest of which measuring 4.6 x 4.2 cm, possibly representing hemangiomas, though the appearance was not typical.  She did not have a colonoscopy before, and was referred to gastroenterology for further evaluation.  Colonoscopy in 01/2019 showed a large rectal mass obstructing the proximal rectum, approximately 10 cm from the anal verge, circumferential.  Biopsy of the mass showed adenocarcinoma.  MRI abdomen showed numerous masses involving all segments of the liver, largest of which measuring ~8cm.  The patient was referred to oncology for further evaluation. -I reviewed the CT and MRI abdomen/pelvis results, as well as colonoscopy findings in detail with the patient -Given the suspicious appearance of the masses in the liver, it would be beneficial to confirm metastatic disease.   Therefore, I have ordered ultrasound-guided liver biopsy for pathologic confirmation. -In addition, she will need CT with contrast to rule out metastatic disease to lungs (currently scheduled on 02/01/2019) -Furthermore, I have ordered molecular studies on the rectal adenocarcinoma to determine if there is any actionable mutation -Finally, in light of her family hx of colon cancer (grandfather) and leiomyosarcoma (mother), I have referred the patient for urgent genetic consult to rule out genetic syndromes  -Pending the work-up above, we will determine the next steps, including surgery vs systemic therapy   Cancer-related pain -Secondary to rectal adenocarcinoma and suspected extensive liver metastases -Minimal improvement with NSAIDs -I have prescribed Norco 5/325 q6hrs PRN for pain -I counseled the patient on the risk of opioid-induced constipation, and recommended her to start Miralax daily (up to BID) -In addition, I have prescribed Senna X, if her BM remains firm  -She may also add milk of magnesia if no improvement  Elevated LFT's  -Likely secondary to extensive liver masses -AST 49, alk phos 190; ALT and Tbili normal  -See pain control and liver biopsy as outlined above -We will monitor it for now   Orders Placed This Encounter  Procedures  . [place] CORE BIOPSY (LIVER)    Standing Status:   Future    Standing Expiration Date:   03/25/2020    Order Specific Question:   Lab orders requested (DO NOT place separate lab orders, these will be automatically ordered during procedure specimen collection):    Answer:   Surgical Pathology    Order Specific Question:   Reason for Exam (SYMPTOM  OR DIAGNOSIS REQUIRED)    Answer:   Suspected metastatic disease to liver    Order Specific Question:   Preferred location?    Answer:  [facility]  . CBC w/ diff    Standing Status:   Future    Standing Expiration Date:   03/01/2020  . CMP    Standing Status:   Future    Standing Expiration  Date:   03/01/2020  . Ambulatory referral to Genetics    Referral Priority:   Urgent    Referral Type:   Consultation    Referral Reason:   Specialty Services Required    Number of Visits Requested:   1   All questions were answered. The patient knows to call the clinic with any problems, questions or concerns. No barriers to learning was detected.  Return in 2 weeks for labs, imaging and pathology results, and clinic appt.   Tish Men, MD 1/14/20219:54 AM  CHIEF COMPLAINTS/PURPOSE OF CONSULTATION:  "My lower abdomen hurts"  HISTORY OF PRESENTING ILLNESS:  Sharon Harris 54 y.o. female is here because of newly diagnosed rectal mass.  Patient established PCP care in 10/2018, and reported symptoms of lower abdominal pain, associated with frequent, small volume, diarrhea with hematochezia.  She also reported associated exertional dyspnea, early satiety, and has lost ~10 lbs since 09/2018.  CT abdomen/pelvis at that time showed multiple liver masses, the largest of which measuring 4.6 x 4.2 cm, possibly representing hemangiomas, though the appearance was not typical.  She has never had a colonoscopy before, and was referred to gastroenterology for further evaluation.  Colonoscopy in 01/2019 showed a large rectal mass obstructing the proximal rectum, approximately 10 cm from the anal verge, circumferential.  The mass was biopsied and showed adenocarcinoma.  The patient was referred to oncology for further evaluation.  Patient reports that she has constant lower abdominal pain, primarily in the lower abdominal quadrants, moderate in intensity, for which she takes PRN ibuprofen with minimal pain relief.  She has small volume watery stools, with exacerbation of abdominal pain during bowel movements.    Of note, she reports a family history of colon cancer (grandfather) and leiomyosarcoma (mother).  She has three children, ages 45, 72 and 28, without any known malignancy.    REVIEW OF SYSTEMS:    Constitutional: ( - ) fevers, ( - )  chills , ( - ) night sweats Eyes: ( - ) blurriness of vision, ( - ) double vision, ( - ) watery eyes Ears, nose, mouth, throat, and face: ( - ) mucositis, ( - ) sore throat Respiratory: ( - ) cough, ( + ) exertional dyspnea, ( - ) wheezes Cardiovascular: ( - ) palpitation, ( - ) chest discomfort, ( - ) lower extremity swelling Gastrointestinal:  ( + ) nausea, ( - ) heartburn, ( + ) change in bowel habits Skin: ( - ) abnormal skin rashes Lymphatics: ( - ) new lymphadenopathy, ( - ) easy bruising Neurological: ( - ) numbness, ( - ) tingling, ( - ) new weaknesses Behavioral/Psych: ( - ) mood change, ( - ) new changes  All other systems were reviewed with the patient and are negative.  I have reviewed her chart and materials related to her cancer extensively and collaborated history with the patient. Summary of oncologic history is as follows: Oncology History  Rectal adenocarcinoma ([place])  11/09/2018 Imaging   CT abdomen/pelvis w/ contrast: IMPRESSION: 1. Diffuse stool throughout the colon. No bowel obstruction. No abscess in the abdomen or pelvis. Appendix absent.   2. Multiple liver masses, likely multiple hemangiomas. Note that these lesions on current examination do not have classic hemangioma appearance.  Given the size and multiplicity of these lesions, further assessment nonemergently would be advisable. In this regard, pre and serial post-contrast MR of the liver would be the optimum imaging study of choice to further evaluate. If there is a contraindication to MR, pre and serial post-contrast CT of the liver would be an alternative for further assessment.   3. 3 mm nodular opacity in the right middle lobe. No follow-up needed if patient is low-risk. Non-contrast chest CT can be considered in 12 months if patient is high-risk. This recommendation follows the consensus statement: Guidelines for Management of Incidental Pulmonary Nodules  Detected on CT Images: From the Fleischner Society 2017; Radiology 2017; 284:228-243.   4. No evident renal or ureteral calculus. No hydronephrosis. Urinary bladder wall thickness normal.   5.  Aortic Atherosclerosis (ICD10-I70.0).   01/20/2019 Procedure   Colonoscopy: - Hemorrhoids were found on perianal exam. - A frond-like/villous, fungating and ulcerated completely obstructing large mass was found in the proximal rectum, located approximately 10 cm from the anal verge. The mass was circumferential and non-traversable. Oozing was present. This was extensively biopsied with a cold forceps for histology. Path was sent as rush. Estimated blood loss was minimal. - Non-bleeding internal hemorrhoids were found during retroflexion. The hemorrhoids were large.   01/20/2019 Imaging   EGD: - The examined esophagus was normal. - The entire examined stomach was normal. Biopsies were taken with a cold forceps for Helicobacter pylori testing. Estimated blood loss was minimal. - The duodenal bulb, first portion of the duodenum and second portion of the duodenum were normal. Biopsies were taken with a cold forceps for histology. Estimated blood loss was minimal.   01/24/2019 Initial Diagnosis   Rectal adenocarcinoma ([place])   01/25/2019 Imaging   MRI abdomen: IMPRESSION: 1. Marked worsening of what is now characterized as hepatic metastatic disease, in this patient who now is known to have rectal cancer.     MEDICAL HISTORY:  Past Medical History:  Diagnosis Date  . Degenerative disc disease, lumbar    L4-L5 worst, DDD    SURGICAL HISTORY: Past Surgical History:  Procedure Laterality Date  . APPENDECTOMY  1984    SOCIAL HISTORY: Social History   Socioeconomic History  . Marital status: Married    Spouse name: Not on file  . Number of children: Not on file  . Years of education: Not on file  . Highest education level: Not on file  Occupational History  . Not on file  Tobacco  Use  . Smoking status: Never Smoker  . Smokeless tobacco: Never Used  Substance and Sexual Activity  . Alcohol use: Yes    Comment: mixed drink, occasionally   . Drug use: Never  . Sexual activity: Not on file  Other Topics Concern  . Not on file  Social History Narrative  . Not on file   Social Determinants of Health   Financial Resource Strain:   . Difficulty of Paying Living Expenses: Not on file  Food Insecurity:   . Worried About Charity fundraiser in the Last Year: Not on file  . Ran Out of Food in the Last Year: Not on file  Transportation Needs:   . Lack of Transportation (Medical): Not on file  . Lack of Transportation (Non-Medical): Not on file  Physical Activity:   . Days of Exercise per Week: Not on file  . Minutes of Exercise per Session: Not on file  Stress:   . Feeling of Stress : Not on  file  Social Connections:   . Frequency of Communication with Friends and Family: Not on file  . Frequency of Social Gatherings with Friends and Family: Not on file  . Attends Religious Services: Not on file  . Active Member of Clubs or Organizations: Not on file  . Attends Archivist Meetings: Not on file  . Marital Status: Not on file  Intimate Partner Violence:   . Fear of Current or Ex-Partner: Not on file  . Emotionally Abused: Not on file  . Physically Abused: Not on file  . Sexually Abused: Not on file    FAMILY HISTORY: Family History  Problem Relation Age of Onset  . Ovarian cancer Mother   . CVA Father   . Colon cancer Paternal Grandfather   . Esophageal cancer Neg Hx   . Rectal cancer Neg Hx   . Stomach cancer Neg Hx     ALLERGIES:  has No Known Allergies.  MEDICATIONS:  Current Outpatient Medications  Medication Sig Dispense Refill  . hydrocortisone (ANUSOL-HC) 2.5 % rectal cream Place 1 application rectally 2 (two) times daily. 30 g 0  . Lysine 500 MG TABS Take 1 tablet by mouth daily.    . Multiple Vitamin (MULTIVITAMIN WITH MINERALS)  TABS tablet Take 1 tablet by mouth daily.    . polyethylene glycol powder (GLYCOLAX/MIRALAX) 17 GM/SCOOP powder Take 17 g by mouth 2 (two) times daily as needed. 3350 g 1  . HYDROcodone-acetaminophen (NORCO) 5-325 MG tablet Take 1 tablet by mouth every 6 (six) hours as needed for moderate pain. 50 tablet 0  . senna-docusate (SENNA S) 8.6-50 MG tablet Take 1 tablet by mouth 2 (two) times daily as needed for mild constipation. 60 tablet 4   No current facility-administered medications for this visit.    PHYSICAL EXAMINATION: ECOG PERFORMANCE STATUS: 1 - Symptomatic but completely ambulatory  Vitals:   01/26/19 0903  BP: 101/65  Pulse: 70  Resp: 18  Temp: (!) 97.3 F (36.3 C)  SpO2: 100%   Filed Weights   01/26/19 0903  Weight: 127 lb 1.9 oz (57.7 kg)    GENERAL: alert, no distress and comfortable SKIN: skin color, texture, turgor are normal, no rashes or significant lesions EYES: conjunctiva are pink and non-injected, sclera clear OROPHARYNX: no exudate, no erythema; lips, buccal mucosa, and tongue normal  NECK: supple, non-tender LUNGS: clear to auscultation with normal breathing effort HEART: regular rate & rhythm, no murmurs, no lower extremity edema ABDOMEN: soft, mild tenderness with palpation in the RUQ, non-distended, normal bowel sounds Musculoskeletal: no cyanosis of digits and no clubbing  PSYCH: alert & oriented x 3, fluent speech NEURO: no focal motor/sensory deficits  LABORATORY DATA:  I have reviewed the data as listed Lab Results  Component Value Date   WBC 7.4 01/26/2019   HGB 13.2 01/26/2019   HCT 39.2 01/26/2019   MCV 91.0 01/26/2019   PLT 261 01/26/2019   Lab Results  Component Value Date   NA 139 01/26/2019   K 4.4 01/26/2019   CL 101 01/26/2019   CO2 32 01/26/2019    RADIOGRAPHIC STUDIES: I have personally reviewed the radiological images as listed and agreed with the findings in the report. MR Abdomen W Wo Contrast  Result Date:  01/25/2019 CLINICAL DATA:  Liver lesion. Blood in stool. EXAM: MRI ABDOMEN WITHOUT AND WITH CONTRAST TECHNIQUE: Multiplanar multisequence MR imaging of the abdomen was performed both before and after the administration of intravenous contrast. CONTRAST:  19m MULTIHANCE GADOBENATE  DIMEGLUMINE 529 MG/ML IV SOLN COMPARISON:  CT study of 11/09/2018 FINDINGS: Lower chest: Limited assessment of the lung bases is unremarkable. Hepatobiliary: Multiple hepatic masses enlarged since the CT study of 11/09/2018. All segments of the liver are involved except for hepatic subsegment III. Lesions show peripheral enhancement and restricted diffusion with thick irregular walls and areas of central necrosis within larger lesions. Fibrotic changes are presumed to be present based on low T2 signal within the central portion of some of these lesions. (Image 33, series 13): 8.6 x 8.2 cm lesion in hepatic subsegment VIII previously 4.1 x 4.1 cm, lesion shows abundant central necrosis. (Image 55, series 13) lesion in the left hepatic lobe measuring 5.3 by 4.2 cm previously 2.0 by 1.7 cm. All other lesions seen on the prior study have enlarged to a similar extent (Image 46, series 13) 2.7 x 2.9 cm lesion in the left hepatic lobe, medial segment new compared to prior study. Also likely with a new approximately 1 cm size lesion in hepatic subsegment VI. At least 15 lesions in the liver. No signs of biliary ductal dilation. Pancreas: Limited assessment of the pancreas due to imaging which extends only through the mid left kidney excluding a portion of the uncinate process is unremarkable. Spleen:  No focal splenic lesion. Adrenals/Urinary Tract: Adrenal glands are normal. Kidneys enhance symmetrically. On multiphase imaging only a portion of the kidneys is included in the field of view. Stomach/Bowel: No sign of acute bowel process though MRI with limited assessment of bowel contents. Rectum not visualized. Vascular/Lymphatic:  Vascular  structures in the abdomen are patent. Other: No sign of ascites or definitive evidence of peritoneal disease though study is limited by susceptibility from adjacent bowel gas and limited coverage. Musculoskeletal: No visible destructive bone lesions. IMPRESSION: 1. Marked worsening of what is now characterized as hepatic metastatic disease, in this patient who now is known to have rectal cancer. 2. Insert PRA call report Electronically Signed   By: Zetta Bills M.D.   On: 01/25/2019 17:54    PATHOLOGY: I have reviewed the pathology reports as documented in the oncologist history.

## 2019-01-25 ENCOUNTER — Other Ambulatory Visit: Payer: Self-pay

## 2019-01-25 ENCOUNTER — Ambulatory Visit
Admission: RE | Admit: 2019-01-25 | Discharge: 2019-01-25 | Disposition: A | Discharge status: Home / Self Care | Payer: 59 | Source: Ambulatory Visit | Attending: Family Medicine | Admitting: Family Medicine

## 2019-01-25 DIAGNOSIS — K769 Liver disease, unspecified: Secondary | ICD-10-CM

## 2019-01-25 MED ORDER — GADOBENATE DIMEGLUMINE 529 MG/ML IV SOLN
12.0000 mL | Freq: Once | INTRAVENOUS | Status: AC | PRN
  Administered 2019-01-25: 12 mL via INTRAVENOUS

## 2019-01-26 ENCOUNTER — Inpatient Hospital Stay: Payer: 59

## 2019-01-26 ENCOUNTER — Telehealth: Payer: Self-pay

## 2019-01-26 ENCOUNTER — Encounter (HOSPITAL_COMMUNITY): Payer: Self-pay | Admitting: Radiology

## 2019-01-26 ENCOUNTER — Other Ambulatory Visit: Payer: Self-pay

## 2019-01-26 ENCOUNTER — Encounter: Payer: Self-pay | Admitting: Hematology

## 2019-01-26 ENCOUNTER — Inpatient Hospital Stay: Payer: 59 | Attending: Hematology | Admitting: Hematology

## 2019-01-26 ENCOUNTER — Encounter: Payer: Self-pay | Admitting: *Deleted

## 2019-01-26 VITALS — BP 101/65 | HR 70 | Temp 97.3°F | Resp 18 | Ht 68.0 in | Wt 127.1 lb

## 2019-01-26 DIAGNOSIS — R160 Hepatomegaly, not elsewhere classified: Secondary | ICD-10-CM | POA: Insufficient documentation

## 2019-01-26 DIAGNOSIS — C20 Malignant neoplasm of rectum: Secondary | ICD-10-CM

## 2019-01-26 DIAGNOSIS — Z800 Family history of malignant neoplasm of digestive organs: Secondary | ICD-10-CM | POA: Diagnosis not present

## 2019-01-26 DIAGNOSIS — Z8041 Family history of malignant neoplasm of ovary: Secondary | ICD-10-CM | POA: Diagnosis not present

## 2019-01-26 DIAGNOSIS — G893 Neoplasm related pain (acute) (chronic): Secondary | ICD-10-CM | POA: Insufficient documentation

## 2019-01-26 DIAGNOSIS — C787 Secondary malignant neoplasm of liver and intrahepatic bile duct: Secondary | ICD-10-CM | POA: Diagnosis not present

## 2019-01-26 DIAGNOSIS — R7989 Other specified abnormal findings of blood chemistry: Secondary | ICD-10-CM | POA: Diagnosis not present

## 2019-01-26 DIAGNOSIS — R918 Other nonspecific abnormal finding of lung field: Secondary | ICD-10-CM | POA: Diagnosis not present

## 2019-01-26 DIAGNOSIS — R1084 Generalized abdominal pain: Secondary | ICD-10-CM

## 2019-01-26 LAB — CMP (CANCER CENTER ONLY)
ALT: 38 U/L (ref 0–44)
AST: 49 U/L — ABNORMAL HIGH (ref 15–41)
Albumin: 4.2 g/dL (ref 3.5–5.0)
Alkaline Phosphatase: 190 U/L — ABNORMAL HIGH (ref 38–126)
Anion gap: 6 (ref 5–15)
BUN: 10 mg/dL (ref 6–20)
CO2: 32 mmol/L (ref 22–32)
Calcium: 9.7 mg/dL (ref 8.9–10.3)
Chloride: 101 mmol/L (ref 98–111)
Creatinine: 0.7 mg/dL (ref 0.44–1.00)
GFR, Est AFR Am: >60 mL/min (ref >60)
GFR, Estimated: >60 mL/min (ref >60)
Glucose, Bld: 111 mg/dL — ABNORMAL HIGH (ref 70–99)
Potassium: 4.4 mmol/L (ref 3.5–5.1)
Sodium: 139 mmol/L (ref 135–145)
Total Bilirubin: 0.5 mg/dL (ref 0.3–1.2)
Total Protein: 6.4 g/dL — ABNORMAL LOW (ref 6.5–8.1)

## 2019-01-26 LAB — CBC WITH DIFFERENTIAL (CANCER CENTER ONLY)
Abs Immature Granulocytes: 0.03 10*3/uL (ref 0.00–0.07)
Basophils Absolute: 0 10*3/uL (ref 0.0–0.1)
Basophils Relative: 0 %
Eosinophils Absolute: 0.1 10*3/uL (ref 0.0–0.5)
Eosinophils Relative: 2 %
HCT: 39.2 % (ref 36.0–46.0)
Hemoglobin: 13.2 g/dL (ref 12.0–15.0)
Immature Granulocytes: 0 %
Lymphocytes Relative: 19 %
Lymphs Abs: 1.4 10*3/uL (ref 0.7–4.0)
MCH: 30.6 pg (ref 26.0–34.0)
MCHC: 33.7 g/dL (ref 30.0–36.0)
MCV: 91 fL (ref 80.0–100.0)
Monocytes Absolute: 0.5 10*3/uL (ref 0.1–1.0)
Monocytes Relative: 7 %
Neutro Abs: 5.3 10*3/uL (ref 1.7–7.7)
Neutrophils Relative %: 72 %
Platelet Count: 261 10*3/uL (ref 150–400)
RBC: 4.31 MIL/uL (ref 3.87–5.11)
RDW: 12 % (ref 11.5–15.5)
WBC Count: 7.4 10*3/uL (ref 4.0–10.5)
nRBC: 0 % (ref 0.0–0.2)

## 2019-01-26 LAB — CEA (IN HOUSE-CHCC): CEA (CHCC-In House): 621.16 ng/mL — ABNORMAL HIGH (ref 0.00–5.00)

## 2019-01-26 MED ORDER — SENNOSIDES-DOCUSATE SODIUM 8.6-50 MG PO TABS: 1.0000 | ORAL_TABLET | Freq: Two times a day (BID) | ORAL | 4 refills | Status: AC | PRN

## 2019-01-26 MED ORDER — HYDROCODONE-ACETAMINOPHEN 5-325 MG PO TABS: 1.0000 | ORAL_TABLET | Freq: Four times a day (QID) | ORAL | 0 refills | Status: AC | PRN

## 2019-01-26 MED FILL — HYDROCODON-APAP 5-325: 5-325 | 12 days supply | Qty: 50 | Fill #0

## 2019-01-26 NOTE — Progress Notes (Signed)
Sharon Harris Female, 54 y.o., [birth year] MRN:  [id number] Phone:  [phone] [name]) PCP:  Shirley, [place], DO Coverage:  [employer] [employer] Next Appt With Radiology (GI-315 CT 1) 02/01/2019 at 8:00 AM  RE: US Liver Biopsy Received: Today Message Contents  Markus Daft, MD  Garth Bigness D  Schedule for US guided core biopsy of a liver lesion.   [place]       Previous Messages   ----- Message -----  From: Garth Bigness D  Sent: 01/26/2019 11:31 AM EST  To: Ir Procedure Requests  Subject: US Liver Biopsy                  Procedure: US Liver Biopsy   Reason: Rectal adenocarcinoma, Suspected metastatic disease to liver   History: CT, MR in computer   Provider: [place], [place]

## 2019-01-26 NOTE — Progress Notes (Signed)
Initial RN Navigator Patient Visit  Name: Sharon Harris Date of Referral : 01/24/19 Diagnosis: Rectal Cancer  Met with patient prior to their visit with MD. Hanley Seamen patient "Your Patient Navigator" handout which explains my role, areas in which I am able to help, and all the contact information for myself and the office. Also gave patient MD and Navigator business card. Reviewed with patient the general overview of expected course after initial diagnosis and time frame for all steps to be completed.  New patient packet given to patient which includes: orientation to office and staff; campus directory; education on My Chart and Advance Directives; and patient centered education on Rectal Cancer  Patient completed visit with Dr. Maylon Peppers  Revisited with patient after MD visit. Patient will need  [facility] testing on rectal biopsy - sent 01/26/2019. Specimen [id number] DOS 01/20/2019  Referral to Genetics - referral placed. Invitae drawn while here in the office - sent 01/26/19. Message sent to Roma Kayser to notify her of referral, and labs already sent.   Liver Biopsy - Spoke with IR scheduling to request an expedited review. MRI has been sent for radiologist review.   Patient is already scheduled for CT scans on 02/01/2019  Patient understands all follow up procedures and expectations. They have my number to reach out for any further clarification or additional needs.

## 2019-01-26 NOTE — Telephone Encounter (Signed)
[facility] calls to give radiology report. Results in Epic. Will forward to PCP.   Talbot Grumbling, RN

## 2019-01-27 ENCOUNTER — Telehealth: Payer: Self-pay | Admitting: Genetic Counselor

## 2019-01-27 NOTE — Telephone Encounter (Signed)
Rescheduled per pt. Called and spoke with pt, confirmed 1/21 appt

## 2019-01-27 NOTE — Telephone Encounter (Signed)
Returned patient's phone call regarding rescheduling an appointment, left a voicemail. 

## 2019-01-27 NOTE — Telephone Encounter (Signed)
Scheduled per 1/14 sch msg. Called and left a msg.

## 2019-01-31 ENCOUNTER — Ambulatory Visit: Payer: Self-pay | Admitting: Surgery

## 2019-01-31 ENCOUNTER — Telehealth: Payer: 59 | Admitting: Genetic Counselor

## 2019-01-31 DIAGNOSIS — C20 Malignant neoplasm of rectum: Secondary | ICD-10-CM | POA: Diagnosis not present

## 2019-01-31 DIAGNOSIS — R160 Hepatomegaly, not elsewhere classified: Secondary | ICD-10-CM | POA: Diagnosis not present

## 2019-01-31 NOTE — H&P (Signed)
Sharon Harris Documented: 01/31/2019 10:47 AM Location: [facility] Patient #: [id number] DOB: 04-Feb-1965 Undefined / Language: Cleophus Molt / Race: White Female  History of Present Illness Adin Hector MD; 01/31/2019 11:43 AM) The patient is a 54 year old female who presents with colorectal cancer. Note for "Colorectal cancer": ` ` ` Patient sent for surgical consultation at the request of Dr Bryan Lemma  Chief Complaint: Rectal cancer ` ` The patient is a woman who noticed intermittent rectal bleeding and change in bowels for the past few months. noted worsening constipation. she normally moves her bowels about twice a week. It was getting worse. More abdominal cramping. She recently relocated from [place] in August 2020. [facility] since she does not have a usual primary care doctor. Claims to be otherwise healthy. Had a CT scan that showed liver masses. some retained stool but no obvious colorectal mass. Underwent MRI which showed the liver masses larger and more suspicious for tumors and not hemangiomas. Underwent colonoscopy found to have bulky mass in proximal rectum. Biopsy consistent with adenocarcinoma. Set up for surgical consultation. She has seen medical oncology.  No personal nor family history of inflammatory bowel disease, irritable bowel syndrome, allergy such as Celiac Sprue, dietary/dairy problems, colitis, ulcers nor gastritis. No recent sick contacts/gastroenteritis. No travel outside the country. No changes in diet. No dysphagia to solids or liquids. No significant heartburn or reflux. No hematochezia, hematemesis, coffee ground emesis. No evidence of prior gastric/peptic ulceration. patient notes she usually ran marathons. Normally tries to walk a few miles a day. However the abdominal cramping as been an issue since she is not as active. She had an open appendectomy in 1984 but no other abdominal surgeries. She does not  smoke. She is not diabetic. She tends to be on the thin side and this had about 10 pounds of unintentional weight loss with some decreased appetite but she is still working. She works in the [employer]  (Review of systems as stated in this history (HPI) or in the review of systems. Otherwise all other 12 point ROS are negative) ` ` `   Past Surgical History Illene Regulus, CMA; 01/31/2019 10:47 AM) Appendectomy  Diagnostic Studies History Lars Mage Spillers, CMA; 01/31/2019 10:47 AM) Colonoscopy within last year Mammogram 1-3 years ago Pap Smear 1-5 years ago  Allergies Illene Regulus, CMA; 01/31/2019 10:48 AM) No Known Drug Allergies [01/31/2019]:  Medication History (Alisha Spillers, CMA; 01/31/2019 10:48 AM) No Current Medications Medications Reconciled  Social History Illene Regulus, CMA; 01/31/2019 10:47 AM) Alcohol use Occasional alcohol use. Caffeine use Tea. No drug use Tobacco use Never smoker.  Family History Illene Regulus, [place]; 01/31/2019 10:47 AM) Cerebrovascular Accident Father. Ovarian Cancer Mother.  Pregnancy / Birth History Illene Regulus, CMA; 01/31/2019 10:47 AM) Age at menarche 85 years. Age of menopause 23-55 Gravida 3 Irregular periods Maternal age 35-25 Para 58  Other Problems Illene Regulus, CMA; 01/31/2019 10:47 AM) Back Pain Cancer Colon Cancer Hemorrhoids Lung Cancer Migraine Headache     Review of Systems (Alisha Spillers CMA; 01/31/2019 10:47 AM) General Present- Appetite Loss, Fatigue and Weight Loss. Not Present- Chills, Fever, Night Sweats and Weight Gain. Skin Not Present- Change in Wart/Mole, Dryness, Hives, Jaundice, New Lesions, Non-Healing Wounds, Rash and Ulcer. HEENT Not Present- Earache, Hearing Loss, Hoarseness, Nose Bleed, Oral Ulcers, Ringing in the Ears, Seasonal Allergies, Sinus Pain, Sore Throat, Visual Disturbances, Wears glasses/contact lenses and Yellow Eyes. Respiratory Not  Present- Bloody sputum, Chronic Cough,  Difficulty Breathing, Snoring and Wheezing. Breast Not Present- Breast Mass, Breast Pain, Nipple Discharge and Skin Changes. Cardiovascular Present- Leg Cramps. Not Present- Chest Pain, Difficulty Breathing Lying Down, Palpitations, Rapid Heart Rate, Shortness of Breath and Swelling of Extremities. Gastrointestinal Present- Abdominal Pain, Bloody Stool, Change in Bowel Habits, Excessive gas, Gets full quickly at meals, Hemorrhoids, Indigestion, Nausea and Rectal Pain. Not Present- Bloating, Chronic diarrhea, Constipation, Difficulty Swallowing and Vomiting. Female Genitourinary Present- Pelvic Pain. Not Present- Frequency, Nocturia, Painful Urination and Urgency. Musculoskeletal Present- Back Pain. Not Present- Joint Pain, Joint Stiffness, Muscle Pain, Muscle Weakness and Swelling of Extremities. Neurological Present- Headaches. Not Present- Decreased Memory, Fainting, Numbness, Seizures, Tingling, Tremor, Trouble walking and Weakness. Psychiatric Not Present- Anxiety, Bipolar, Change in Sleep Pattern, Depression, Fearful and Frequent crying. Endocrine Not Present- Cold Intolerance, Excessive Hunger, Hair Changes, Heat Intolerance, Hot flashes and New Diabetes. Hematology Not Present- Blood Thinners, Easy Bruising, Excessive bleeding, Gland problems, HIV and Persistent Infections.  Vitals (Alisha Spillers CMA; 01/31/2019 10:48 AM) 01/31/2019 10:48 AM Weight: 128 lb Height: 68in Body Surface Area: 1.69 m Body Mass Index: 19.46 kg/m  Pulse: 64 (Regular)  BP: 98/58 (Sitting, Left Arm, Standard)        Physical Exam Adin Hector MD; 01/31/2019 11:21 AM)  General Mental Status-Alert. General Appearance-Not in acute distress, Not Sickly. Orientation-Oriented X3. Hydration-Well hydrated. Voice-Normal.  Integumentary Global Assessment Upon inspection and palpation of skin surfaces of the - Axillae: non-tender, no inflammation or  ulceration, no drainage. and Distribution of scalp and body hair is normal. General Characteristics Temperature - normal warmth is noted.  Head and Neck Head-normocephalic, atraumatic with no lesions or palpable masses. Face Global Assessment - atraumatic, no absence of expression. Neck Global Assessment - no abnormal movements, no bruit auscultated on the right, no bruit auscultated on the left, no decreased range of motion, non-tender. Trachea-midline. Thyroid Gland Characteristics - non-tender.  Eye Eyeball - Left-Extraocular movements intact, No Nystagmus - Left. Eyeball - Right-Extraocular movements intact, No Nystagmus - Right. Cornea - Left-No Hazy - Left. Cornea - Right-No Hazy - Right. Sclera/Conjunctiva - Left-No scleral icterus, No Discharge - Left. Sclera/Conjunctiva - Right-No scleral icterus, No Discharge - Right. Pupil - Left-Direct reaction to light normal. Pupil - Right-Direct reaction to light normal.  ENMT Ears Pinna - Left - no drainage observed, no generalized tenderness observed. Pinna - Right - no drainage observed, no generalized tenderness observed. Nose and Sinuses External Inspection of the Nose - no destructive lesion observed. Inspection of the nares - Left - quiet respiration. Inspection of the nares - Right - quiet respiration. Mouth and Throat Lips - Upper Lip - no fissures observed, no pallor noted. Lower Lip - no fissures observed, no pallor noted. Nasopharynx - no discharge present. Oral Cavity/Oropharynx - Tongue - no dryness observed. Oral Mucosa - no cyanosis observed. Hypopharynx - no evidence of airway distress observed.  Chest and Lung Exam Inspection Movements - Normal and Symmetrical. Accessory muscles - No use of accessory muscles in breathing. Palpation Palpation of the chest reveals - Non-tender. Auscultation Breath sounds - Normal and Clear.  Cardiovascular Auscultation Rhythm - Regular. Murmurs & Other Heart  Sounds - Auscultation of the heart reveals - No Murmurs and No Systolic Clicks.  Abdomen Inspection Inspection of the abdomen reveals - No Visible peristalsis and No Abnormal pulsations. Umbilicus - No Bleeding, No Urine drainage. Palpation/Percussion Palpation and Percussion of the abdomen reveal - Soft, Non Tender, No Rebound tenderness, No Rigidity (guarding) and No  Cutaneous hyperesthesia. Note: Abdomen soft. flat. Nontender. Nondistended. Scarring RLQ consistent with prior appendectomy. Not distended. No diastasis recti. No umbilical or other anterior abdominal wall hernias  Female Genitourinary Sexual Maturity Tanner 5 - Adult hair pattern. Note: No vaginal bleeding nor discharge  Rectal Note: some perianal soiling. Right anterior and left posterior lateral anal tags. Normal sphincter tone. At that the tip my finger around 9 cm is narrowing and fullness that is mobile suspicious for the bulky proximal rectal cancer. No fissure or fistula. No pilonidal disease.  Peripheral Vascular Upper Extremity Inspection - Left - No Cyanotic nailbeds - Left, Not Ischemic. Inspection - Right - No Cyanotic nailbeds - Right, Not Ischemic.  Neurologic Neurologic evaluation reveals -normal attention span and ability to concentrate, able to name objects and repeat phrases. Appropriate fund of knowledge , normal sensation and normal coordination. Mental Status Affect - not angry, not paranoid. Cranial Nerves-Normal Bilaterally. Gait-Normal.  Neuropsychiatric Mental status exam performed with findings of-able to articulate well with normal speech/language, rate, volume and coherence, thought content normal with ability to perform basic computations and apply abstract reasoning and no evidence of hallucinations, delusions, obsessions or homicidal/suicidal ideation.  Musculoskeletal Global Assessment Spine, Ribs and Pelvis - no instability, subluxation or laxity. Right Upper Extremity -  no instability, subluxation or laxity.  Lymphatic Head & Neck  General Head & Neck Lymphatics: Bilateral - Description - No Localized lymphadenopathy. Axillary  General Axillary Region: Bilateral - Description - No Localized lymphadenopathy. Femoral & Inguinal  Generalized Femoral & Inguinal Lymphatics: Left - Description - No Localized lymphadenopathy. Right - Description - No Localized lymphadenopathy.    Assessment & Plan Adin Hector MD; 01/31/2019 11:44 AM)  RECTAL ADENOCARCINOMA (C20) Impression: bulky tumor of proximal/mid rectum consistent with adenocarcinoma with liver masses strongly suspicious for metastatic disease.  Complete metastatic workup.  Liver biopsies next week.  MRI of pelvis for staging. suspect at least T3 since it is bulky but not obviously fixed on exam.  continue MiraLAX and even low residue diet to override constipation.  I had long discussion with the patient with the pathophysiology and natural history of colorectal cancer. My instinct is that she would benefit from systemic chemotherapy to try and get everything under control and regroup. Should she have progressive symptoms of constipation, she may benefit from low anterior resection or stenting to help palliate the obstruction. try to stent versus colostomy if truly becomes fully obstructed. Hopefully if she can get on chemotherapy the primary lesion will shrink and her symptoms will abate. I would hold off on doing any radiation in that region unless there is discussion of possible surgical resection the future. Usually we do not resect the primary lesion unless all other disease has resolved and has remained gone off chemotherapy for more than 1-2 years.  Discussed at tumor board to make sure we are all on board.  Most likely she would benefit from a Port-A-Cath for her chemotherapy. We will see if we can do this versus interventional radiology depending who is available first.  Current  Plans Return to clinic as needed.  Soreness, decreased appetite, and poor energy level are common problems after surgery. While many people can struggle with a bad day, these concerns should gradually fade away or at least improve. Much of your recovery depends on your health & the severity of your operation. Please call if you have any further questions / concerns related to surgery.  Increase activity as tolerated to regular everyday activity.  Consider daily low impact exercise every day such as walking an hour a day.  Do not push through pain. If it hurts to do it, then don't do it.  Diet as tolerated. Low fat high fiber diet ideal. 30 g fiber a day ideal. Consider taking a daily fiber supplement to keep your bowels regular.  Followup with your primary care physician for other health issues as would normally be done.  Consider screening for malignancies (breast, prostate, colon, melanoma, etc) as appropriate. Discuss with you primary care physician.  Follow up if no improvement or if symptoms worsen Follow Up - Call [facility] office after tests / studies doneto discuss further plans Pt Education - [facility] Colorectal Cancer (AT): discussed with patient and provided information.  MASS OF MULTIPLE SITES OF LIVER (R16.0) Impression: bilateral liver lesions. Not consistent with hemangiomas. Most likely liver metastasis. Concern of increased size on MRI compared to CT scan.  Agree with liver biopsy. That is post Week. Follow-up CT scan of abdomen and pelvis and chest for metastatic workup. Doctors wanted a repeat to get a sense of how rapid or stable ease lesions are.  She's not a candidate for surgical resection at this time for her liver lesions  Use of a central venous catheter for intravenous therapy was discussed.  Technique of catheter placement using ultrasound and fluoroscopy guidance was discussed.  Risks such as bleeding, infection, injury to other organs, need for repair of  tissues / organs, need for further treatment, pneumothorax, catheter occlusion, reoperation, and other risks were discussed.   I noted a good likelihood this will help address the problem.  Questions were answered.  The patient expressed understanding & wishes to proceed.

## 2019-02-01 ENCOUNTER — Ambulatory Visit
Admission: RE | Admit: 2019-02-01 | Discharge: 2019-02-01 | Disposition: A | Discharge status: Home / Self Care | Payer: 59 | Source: Ambulatory Visit | Attending: Gastroenterology | Admitting: Gastroenterology

## 2019-02-01 ENCOUNTER — Other Ambulatory Visit: Payer: Self-pay

## 2019-02-01 DIAGNOSIS — D128 Benign neoplasm of rectum: Secondary | ICD-10-CM

## 2019-02-01 DIAGNOSIS — K6289 Other specified diseases of anus and rectum: Secondary | ICD-10-CM

## 2019-02-01 DIAGNOSIS — C20 Malignant neoplasm of rectum: Secondary | ICD-10-CM | POA: Diagnosis not present

## 2019-02-01 MED ORDER — IOPAMIDOL (ISOVUE-300) INJECTION 61%
100.0000 mL | Freq: Once | INTRAVENOUS | Status: AC | PRN
  Administered 2019-02-01: 09:00:00 100 mL via INTRAVENOUS

## 2019-02-02 ENCOUNTER — Encounter: Payer: Self-pay | Admitting: Genetic Counselor

## 2019-02-02 ENCOUNTER — Inpatient Hospital Stay (HOSPITAL_BASED_OUTPATIENT_CLINIC_OR_DEPARTMENT_OTHER): Payer: 59 | Admitting: Genetic Counselor

## 2019-02-02 DIAGNOSIS — Z800 Family history of malignant neoplasm of digestive organs: Secondary | ICD-10-CM | POA: Diagnosis not present

## 2019-02-02 DIAGNOSIS — C20 Malignant neoplasm of rectum: Secondary | ICD-10-CM | POA: Diagnosis not present

## 2019-02-02 DIAGNOSIS — Z8049 Family history of malignant neoplasm of other genital organs: Secondary | ICD-10-CM | POA: Diagnosis not present

## 2019-02-02 NOTE — Progress Notes (Signed)
REFERRING PROVIDER: Tish Men, MD [place],  [place] [address]  PRIMARY PROVIDER:  Shirley, [place], DO  PRIMARY REASON FOR VISIT:  1. Rectal adenocarcinoma ([place])   2. Family history of colon cancer   3. Family history of uterine cancer      I connected with Sharon Harris on 02/02/2019 at 10:00 am EDT by Webex video conference and verified that I am speaking with the correct person using two identifiers.   Patient location: home/work Provider location: [facility] office  HISTORY OF PRESENT ILLNESS:   Sharon Harris, a 54 y.o. female, was seen for a [place] cancer genetics consultation at the request of Dr. Maylon Peppers due to a personal and family history of colon cancer.  Sharon Harris presents to clinic today to discuss the possibility of a hereditary predisposition to cancer, genetic testing, and to further clarify her future cancer risks, as well as potential cancer risks for family members.   In 2021, at the age of 62, Sharon Harris was diagnosed with rectal adenocarcinoma.     CANCER HISTORY:  Oncology History  Rectal adenocarcinoma ([place])  11/09/2018 Imaging   CT abdomen/pelvis w/ contrast: IMPRESSION: 1. Diffuse stool throughout the colon. No bowel obstruction. No abscess in the abdomen or pelvis. Appendix absent.   2. Multiple liver masses, likely multiple hemangiomas. Note that these lesions on current examination do not have classic hemangioma appearance. Given the size and multiplicity of these lesions, further assessment nonemergently would be advisable. In this regard, pre and serial post-contrast MR of the liver would be the optimum imaging study of choice to further evaluate. If there is a contraindication to MR, pre and serial post-contrast CT of the liver would be an alternative for further assessment.   3. 3 mm nodular opacity in the right middle lobe. No follow-up needed if patient is low-risk. Non-contrast chest CT can be considered in 12 months if patient is high-risk.  This recommendation follows the consensus statement: Guidelines for Management of Incidental Pulmonary Nodules Detected on CT Images: From the Fleischner Society 2017; Radiology 2017; 284:228-243.   4. No evident renal or ureteral calculus. No hydronephrosis. Urinary bladder wall thickness normal.   5.  Aortic Atherosclerosis (ICD10-I70.0).   01/20/2019 Procedure   Colonoscopy: - Hemorrhoids were found on perianal exam. - A frond-like/villous, fungating and ulcerated completely obstructing large mass was found in the proximal rectum, located approximately 10 cm from the anal verge. The mass was circumferential and non-traversable. Oozing was present. This was extensively biopsied with a cold forceps for histology. Path was sent as rush. Estimated blood loss was minimal. - Non-bleeding internal hemorrhoids were found during retroflexion. The hemorrhoids were large.   01/20/2019 Imaging   EGD: - The examined esophagus was normal. - The entire examined stomach was normal. Biopsies were taken with a cold forceps for Helicobacter pylori testing. Estimated blood loss was minimal. - The duodenal bulb, first portion of the duodenum and second portion of the duodenum were normal. Biopsies were taken with a cold forceps for histology. Estimated blood loss was minimal.   01/24/2019 Initial Diagnosis   Rectal adenocarcinoma ([place])   01/25/2019 Imaging   MRI abdomen: IMPRESSION: 1. Marked worsening of what is now characterized as hepatic metastatic disease, in this patient who now is known to have rectal cancer.      RISK FACTORS:  Menarche was at age 35.  First live birth at age 18.  OCP use for approximately 3 years.  Ovaries intact: yes.  Hysterectomy: no.  Menopausal status: postmenopausal.  HRT use: 0 years. Colonoscopy: yes; 01/20/19. Mammogram within the last year: no, last mammogram about 2 years ago. Number of breast biopsies: 0. Any excessive radiation exposure in the past:  no  Past Medical History:  Diagnosis Date  . Degenerative disc disease, lumbar    L4-L5 worst, DDD  . Family history of colon cancer   . Family history of uterine cancer    leiomyosarcoma in mother, listed as uterine cancer on death certificate    Past Surgical History:  Procedure Laterality Date  . APPENDECTOMY  1984    Social History   Socioeconomic History  . Marital status: Married    Spouse name: Not on file  . Number of children: Not on file  . Years of education: Not on file  . Highest education level: Not on file  Occupational History  . Not on file  Tobacco Use  . Smoking status: Never Smoker  . Smokeless tobacco: Never Used  Substance and Sexual Activity  . Alcohol use: Yes    Comment: mixed drink, occasionally   . Drug use: Never  . Sexual activity: Not on file  Other Topics Concern  . Not on file  Social History Narrative  . Not on file   Social Determinants of Health   Financial Resource Strain:   . Difficulty of Paying Living Expenses: Not on file  Food Insecurity:   . Worried About Charity fundraiser in the Last Year: Not on file  . Ran Out of Food in the Last Year: Not on file  Transportation Needs:   . Lack of Transportation (Medical): Not on file  . Lack of Transportation (Non-Medical): Not on file  Physical Activity:   . Days of Exercise per Week: Not on file  . Minutes of Exercise per Session: Not on file  Stress:   . Feeling of Stress : Not on file  Social Connections:   . Frequency of Communication with Friends and Family: Not on file  . Frequency of Social Gatherings with Friends and Family: Not on file  . Attends Religious Services: Not on file  . Active Member of Clubs or Organizations: Not on file  . Attends Archivist Meetings: Not on file  . Marital Status: Not on file     FAMILY HISTORY:  We obtained a detailed, 4-generation family history.  Significant diagnoses are listed below: Family History  Problem  Relation Age of Onset  . Cancer Mother 5       uterine leiomyosarcoma  . CVA Father   . Colon cancer Paternal Grandfather        dx. 6s  . Esophageal cancer Neg Hx   . Rectal cancer Neg Hx   . Stomach cancer Neg Hx    Sharon Harris has two sons, ages 51 and 82, and one daughter, age 56. None of her children have had cancer. She has two sisters and one brother who are all in their 10s and have not had cancer.  Sharon Harris mother was diagnosed with uterine leiomyosarcoma at age 63 and died at age 71. She notes that her mother's death certificate said "uterine cancer". She does not have any maternal aunts or uncles. Her maternal grandmother died in her early 28s, and her maternal grandfather died in his early 35s. She does not think either of her maternal grandparents had cancer.  Sharon Harris father died at age 34 and did not have a history of cancer.  She had two paternal uncles - one who died at age 86 and one who died older than 14. Her paternal grandmother died at age 20 and her paternal grandfather died at age 42 from colon cancer, which was initially diagnosed in his 52s. There are no other known diagnoses of cancer on the paternal side of the family.  Sharon Harris is unaware of previous family history of genetic testing for hereditary cancer risks. Her maternal ancestors are of [place] and [place] descent, and paternal ancestors are of [place] descent. There is no reported Ashkenazi Jewish ancestry. There is no known consanguinity.  GENETIC COUNSELING ASSESSMENT: Sharon Harris is a 54 y.o. female with a personal and family history of colorectal cancer which is not suggestive of a hereditary cancer syndrome. We, therefore, discussed and recommended the following at today's visit.   DISCUSSION:  We discussed that, in general, most cancer is not inherited in families but instead is sporadic or familial. Sporadic cancers occur by chance and typically happen at older ages (>50 years) as this type of cancer is  caused by genetic changes acquired during an individual's lifetime. Some families have more cancers than would be expected by chance; however, the ages or types of cancer are not consistent with a known genetic mutation or known genetic mutations have been ruled out. This type of familial cancer is thought to be due to a combination of multiple genetic, environmental, hormonal, and lifestyle factors. While this combination of factors likely increases the risk of cancer, the exact source of this risk is not currently identifiable or testable.    We discussed that approximately 5-10% of colorectal cancer is hereditary, meaning that it is due to a mutation in a single gene that is passed down from generation to generation in a family. Most hereditary cases of colorectal cancer are due to Lynch syndrome. Lynch syndrome is a genetic condition that is associated mainly with an increased risk for colorectal and endometrial cancers, in addition to other cancer types. There are other genetic conditions that can be associated an increased risk for colorectal cancer. We discussed that testing can be beneficial for several reasons, including knowing about other cancer risks, identifying potential screening and risk-reduction options that may be appropriate, and to understand if other family members could be at risk for cancer and allow them to undergo genetic testing.   We reviewed the characteristics, features and inheritance patterns of hereditary cancer syndromes. We discussed with Sharon Harris that the personal and family history does not meet insurance or NCCN criteria for genetic testing and, therefore, is not highly consistent with a hereditary cancer syndrome.  We feel she is at low risk to harbor a gene mutation associated with such a condition. Thus, we did not recommend any genetic testing, at this time, and recommended Sharon Harris continue to follow the cancer screening guidelines given by her primary healthcare  provider.  We also discussed that the results of her tumor testing (MSI and mismatch repair IHC) may provide more insight in to her risk to have Lynch syndrome. If these results are normal, no additional genetic testing would be recommended. If these results are abnormal, additional consultation and genetic testing may be warranted at that time.    PLAN:  Sharon Harris did not wish to pursue genetic testing at today's visit. We understand this decision and remain available to coordinate genetic testing at any time in the future. We, therefore, recommend Sharon Harris continue to follow the cancer screening guidelines given  by her primary healthcare provider.  Sharon Harris questions were answered to her satisfaction today. Our contact information was provided should additional questions or concerns arise. Thank you for the referral and allowing [place] to share in the care of your patient.   Clint Guy, MS, [facility] Certified Genetic Counselor [email or web address] Phone: [phone]  The patient was seen for a total of 25 minutes in face-to-face genetic counseling.  This patient was discussed with Drs. Magrinat, Lindi Adie and/or Burr Medico who agrees with the above.    _______________________________________________________________________ For Office Staff:  Number of people involved in session: 1 Was an Intern/ student involved with case: no

## 2019-02-03 ENCOUNTER — Encounter: Payer: Self-pay | Admitting: *Deleted

## 2019-02-03 ENCOUNTER — Other Ambulatory Visit: Payer: Self-pay | Admitting: Hematology

## 2019-02-03 DIAGNOSIS — C20 Malignant neoplasm of rectum: Secondary | ICD-10-CM

## 2019-02-03 MED ORDER — MORPHINE SULFATE ER 15 MG PO TBCR: 15.0000 mg | EXTENDED_RELEASE_TABLET | Freq: Two times a day (BID) | ORAL | 0 refills | Status: AC

## 2019-02-03 MED FILL — MORPHINE SULF ER 15 MG TAB: 15 | 30 days supply | Qty: 60 | Fill #0

## 2019-02-03 NOTE — Progress Notes (Signed)
Voicemail received from patient. She expresses that she is experiencing more pain. It's located in her R side, lower back. Her prescribed medication isn't helping and she wants to know if she can go up in dose.  Spoke with Dr Maylon Peppers. He will add long acting pain management to her regimen. She should use long acting MSContin teice daily and use [place] for breakthrough pain. She is to notify the office if this isn't making her pain manageable.   Reviewed instructions with patient. She is aware of new prescription and pharmacy confirmed.   While speaking to patient, she asked if we could go ahead and order/schedule port placement for upcoming treatment. Spoke to Dr Maylon Peppers and order placed.   Spoke to [place] in IR and scheduled port placement for 02/10/2019 at 8am. She is NPO after midnight and will need a driver. Called patient and gave her all information. Confirmed understanding with patient.

## 2019-02-05 ENCOUNTER — Other Ambulatory Visit: Payer: Self-pay | Admitting: Radiology

## 2019-02-06 ENCOUNTER — Other Ambulatory Visit: Payer: Self-pay | Admitting: Radiology

## 2019-02-07 ENCOUNTER — Other Ambulatory Visit: Payer: Self-pay | Admitting: Family

## 2019-02-07 ENCOUNTER — Ambulatory Visit (HOSPITAL_COMMUNITY)
Admission: RE | Admit: 2019-02-07 | Discharge: 2019-02-07 | Disposition: A | Discharge status: Home / Self Care | Payer: 59 | Source: Ambulatory Visit | Attending: Hematology | Admitting: Hematology

## 2019-02-07 ENCOUNTER — Other Ambulatory Visit: Payer: Self-pay

## 2019-02-07 ENCOUNTER — Other Ambulatory Visit: Payer: Self-pay | Admitting: Surgery

## 2019-02-07 ENCOUNTER — Encounter (HOSPITAL_COMMUNITY): Payer: Self-pay

## 2019-02-07 DIAGNOSIS — I700 Atherosclerosis of aorta: Secondary | ICD-10-CM | POA: Diagnosis not present

## 2019-02-07 DIAGNOSIS — C20 Malignant neoplasm of rectum: Secondary | ICD-10-CM

## 2019-02-07 DIAGNOSIS — Z79899 Other long term (current) drug therapy: Secondary | ICD-10-CM | POA: Insufficient documentation

## 2019-02-07 DIAGNOSIS — M5136 Other intervertebral disc degeneration, lumbar region: Secondary | ICD-10-CM | POA: Insufficient documentation

## 2019-02-07 DIAGNOSIS — R911 Solitary pulmonary nodule: Secondary | ICD-10-CM | POA: Diagnosis not present

## 2019-02-07 DIAGNOSIS — Z823 Family history of stroke: Secondary | ICD-10-CM | POA: Diagnosis not present

## 2019-02-07 DIAGNOSIS — K7689 Other specified diseases of liver: Secondary | ICD-10-CM | POA: Diagnosis not present

## 2019-02-07 DIAGNOSIS — Z800 Family history of malignant neoplasm of digestive organs: Secondary | ICD-10-CM | POA: Insufficient documentation

## 2019-02-07 DIAGNOSIS — Z8049 Family history of malignant neoplasm of other genital organs: Secondary | ICD-10-CM | POA: Insufficient documentation

## 2019-02-07 DIAGNOSIS — R160 Hepatomegaly, not elsewhere classified: Secondary | ICD-10-CM | POA: Insufficient documentation

## 2019-02-07 LAB — CBC WITH DIFFERENTIAL/PLATELET
Abs Immature Granulocytes: 0.03 10*3/uL (ref 0.00–0.07)
Basophils Absolute: 0 10*3/uL (ref 0.0–0.1)
Basophils Relative: 0 %
Eosinophils Absolute: 0.1 10*3/uL (ref 0.0–0.5)
Eosinophils Relative: 2 %
HCT: 42.7 % (ref 36.0–46.0)
Hemoglobin: 14.2 g/dL (ref 12.0–15.0)
Immature Granulocytes: 0 %
Lymphocytes Relative: 20 %
Lymphs Abs: 1.5 10*3/uL (ref 0.7–4.0)
MCH: 31.1 pg (ref 26.0–34.0)
MCHC: 33.3 g/dL (ref 30.0–36.0)
MCV: 93.4 fL (ref 80.0–100.0)
Monocytes Absolute: 0.7 10*3/uL (ref 0.1–1.0)
Monocytes Relative: 10 %
Neutro Abs: 5.1 10*3/uL (ref 1.7–7.7)
Neutrophils Relative %: 68 %
Platelets: 255 10*3/uL (ref 150–400)
RBC: 4.57 MIL/uL (ref 3.87–5.11)
RDW: 12.5 % (ref 11.5–15.5)
WBC: 7.5 10*3/uL (ref 4.0–10.5)
nRBC: 0 % (ref 0.0–0.2)

## 2019-02-07 LAB — PROTIME-INR
INR: 1 (ref 0.8–1.2)
Prothrombin Time: 13.2 seconds (ref 11.4–15.2)

## 2019-02-07 LAB — COMPREHENSIVE METABOLIC PANEL
ALT: 39 U/L (ref 0–44)
AST: 61 U/L — ABNORMAL HIGH (ref 15–41)
Albumin: 4.2 g/dL (ref 3.5–5.0)
Alkaline Phosphatase: 202 U/L — ABNORMAL HIGH (ref 38–126)
Anion gap: 10 (ref 5–15)
BUN: 7 mg/dL (ref 6–20)
CO2: 29 mmol/L (ref 22–32)
Calcium: 9.4 mg/dL (ref 8.9–10.3)
Chloride: 100 mmol/L (ref 98–111)
Creatinine, Ser: 0.54 mg/dL (ref 0.44–1.00)
GFR calc Af Amer: >60 mL/min (ref >60)
GFR calc non Af Amer: >60 mL/min (ref >60)
Glucose, Bld: 98 mg/dL (ref 70–99)
Potassium: 3.6 mmol/L (ref 3.5–5.1)
Sodium: 139 mmol/L (ref 135–145)
Total Bilirubin: 1.1 mg/dL (ref 0.3–1.2)
Total Protein: 7.3 g/dL (ref 6.5–8.1)

## 2019-02-07 MED ORDER — LIDOCAINE HCL 1 % IJ SOLN
INTRAMUSCULAR | Status: AC
  Filled 2019-02-07: qty 20

## 2019-02-07 MED ORDER — FENTANYL CITRATE (PF) 100 MCG/2ML IJ SOLN
INTRAMUSCULAR | Status: AC | PRN
  Administered 2019-02-07 (×2): 25 ug via INTRAVENOUS

## 2019-02-07 MED ORDER — SODIUM CHLORIDE 0.9 % IV SOLN: INTRAVENOUS | Status: DC

## 2019-02-07 MED ORDER — NALOXONE HCL 0.4 MG/ML IJ SOLN
INTRAMUSCULAR | Status: AC
  Filled 2019-02-07: qty 1

## 2019-02-07 MED ORDER — FLUMAZENIL 0.5 MG/5ML IV SOLN
INTRAVENOUS | Status: AC
  Filled 2019-02-07: qty 5

## 2019-02-07 MED ORDER — MIDAZOLAM HCL 2 MG/2ML IJ SOLN
INTRAMUSCULAR | Status: AC | PRN
  Administered 2019-02-07 (×2): 0.5 mg via INTRAVENOUS

## 2019-02-07 MED ORDER — MIDAZOLAM HCL 2 MG/2ML IJ SOLN
INTRAMUSCULAR | Status: AC
  Filled 2019-02-07: qty 4

## 2019-02-07 MED ORDER — HYDROCODONE-ACETAMINOPHEN 5-325 MG PO TABS: 1.0000 | ORAL_TABLET | ORAL | Status: DC | PRN

## 2019-02-07 MED ORDER — FENTANYL CITRATE (PF) 100 MCG/2ML IJ SOLN
INTRAMUSCULAR | Status: AC
  Filled 2019-02-07: qty 4

## 2019-02-07 NOTE — Consult Note (Signed)
Chief Complaint: Patient was seen in consultation today for image guided liver lesion biopsy  Referring Physician(s): Zhao,Yan  Supervising Physician: Arne Cleveland  Patient Status: [facility] - Out-pt  History of Present Illness: Sharon Harris is a 53 y.o. female with history of recently diagnosed rectal cancer and latest imaging findings revealing:  1. Multiple bulky hypodense, rim enhancing masses of the liver, which are in keeping with findings of recent prior MRI and greatly enlarged compared to prior CT dated 11/09/2018. The largest index mass of the central right lobe of the liver measures 9.6 x 9.1 cm, previously 4.2 x 3.9 cm (series 2, image 50). Findings are consistent with hepatic metastatic disease. 2. There is circumferential thickening of the rectum, which appears increased compared to prior examination (series 2, image 25), in keeping with primary rectal malignancy. There is a soft tissue nodule adjacent to the superior rectum measuring 1.5 cm, enlarged compared to prior examination, previously 1.0 cm (series 2, image 97, series 5, image 101), consistent with perirectal nodal metastatic disease. 3. There is a 7 mm subpleural nodule of the anterior right middle lobe, significantly enlarged compared to prior examination, previously 3 mm (series 4, image 114). This is presumably pulmonary metastatic disease. 4. Biapical pleuroparenchymal scarring and multiple small nodules of the bilateral lung apices, the largest a 6 mm pulmonary nodule of the right apex (series 4, image 36). These findings are likely sequelae of prior infection or inflammation. Attention on follow-up. 5.  Aortic Atherosclerosis (ICD10-I70.0).  She presents today for image guided liver lesion biopsy for further evaluation.  Past Medical History:  Diagnosis Date   Degenerative disc disease, lumbar    L4-L5 worst, DDD   Family history of colon cancer    Family history of uterine cancer    leiomyosarcoma in mother, listed as uterine cancer on death certificate    Past Surgical History:  Procedure Laterality Date   APPENDECTOMY  1984    Allergies: Patient has no known allergies.  Medications: Prior to Admission medications   Medication Sig Start Date End Date Taking? Authorizing Provider  HYDROcodone-acetaminophen (NORCO) 5-325 MG tablet Take 1 tablet by mouth every 6 (six) hours as needed for moderate pain. 01/26/19   Tish Men, MD  hydrocortisone (ANUSOL-HC) 2.5 % rectal cream Place 1 application rectally 2 (two) times daily. 11/02/18   Shirley, [place], DO  Lysine 500 MG TABS Take 1 tablet by mouth daily.    [provider]  morphine (MS CONTIN) 15 MG 12 hr tablet Take 1 tablet (15 mg total) by mouth every 12 (twelve) hours. 02/03/19 03/05/19  Tish Men, MD  Multiple Vitamin (MULTIVITAMIN WITH MINERALS) TABS tablet Take 1 tablet by mouth daily.    [provider]  polyethylene glycol powder (GLYCOLAX/MIRALAX) 17 GM/SCOOP powder Take 17 g by mouth 2 (two) times daily as needed. 11/02/18   Shirley, [place], DO  senna-docusate (SENNA S) 8.6-50 MG tablet Take 1 tablet by mouth 2 (two) times daily as needed for mild constipation. 01/26/19 02/25/19  Tish Men, MD     Family History  Problem Relation Age of Onset   Cancer Mother 41       uterine leiomyosarcoma   CVA Father    Colon cancer Paternal Grandfather        dx. 76s   Esophageal cancer Neg Hx    Rectal cancer Neg Hx    Stomach cancer Neg Hx     Social History   Socioeconomic History   Marital status:  Married    Spouse name: Not on file   Number of children: Not on file   Years of education: Not on file   Highest education level: Not on file  Occupational History   Not on file  Tobacco Use   Smoking status: Never Smoker   Smokeless tobacco: Never Used  Substance and Sexual Activity   Alcohol use: Yes    Comment: mixed drink, occasionally    Drug use: Never   Sexual  activity: Not on file  Other Topics Concern   Not on file  Social History Narrative   Not on file   Social Determinants of Health   Financial Resource Strain:    Difficulty of Paying Living Expenses: Not on file  Food Insecurity:    Worried About Sharon Harris in the Last Year: Not on file   Ran Out of Food in the Last Year: Not on file  Transportation Needs:    Lack of Transportation (Medical): Not on file   Lack of Transportation (Non-Medical): Not on file  Physical Activity:    Days of Exercise per Week: Not on file   Minutes of Exercise per Session: Not on file  Stress:    Feeling of Stress : Not on file  Social Connections:    Frequency of Communication with Friends and Family: Not on file   Frequency of Social Gatherings with Friends and Family: Not on file   Attends Religious Services: Not on file   Active Member of Clubs or Organizations: Not on file   Attends Archivist Meetings: Not on file   Marital Status: Not on file      Review of Systems denies fever, chest pain, cough, vomiting; she does have occasional headaches, some dyspnea with exertion, abdominal/back pain, intermittent nausea and some rectal bleeding  Vital Signs: Blood Pressure 105/73, heart rate 69, respirations 18, O2 sat 100% room air, temperature 98.4   Physical Exam awake, alert.  Chest clear to auscultation bilaterally.  Heart with regular rate and rhythm.  Abdomen with prominent epigastric/liver mass tender to palpation, positive bowel sounds, no lower extremity edema.  Imaging: CT CHEST W CONTRAST  Result Date: 02/01/2019 CLINICAL DATA:  Metastatic rectal cancer EXAM: CT CHEST, ABDOMEN, AND PELVIS WITH CONTRAST TECHNIQUE: Multidetector CT imaging of the chest, abdomen and pelvis was performed following the standard protocol during bolus administration of intravenous contrast. CONTRAST:  168mL ISOVUE-300 IOPAMIDOL (ISOVUE-300) INJECTION 61%, additional oral  enteric contrast COMPARISON:  MR abdomen, 01/25/2019, CT abdomen pelvis, 11/09/2018 FINDINGS: CT CHEST FINDINGS Cardiovascular: No significant vascular findings. Normal heart size. No pericardial effusion. Mediastinum/Nodes: No enlarged mediastinal, hilar, or axillary lymph nodes. Thyroid gland, trachea, and esophagus demonstrate no significant findings. Lungs/Pleura: Biapical pleuroparenchymal scarring and multiple small nodules of the bilateral lung apices, the largest a 6 mm pulmonary nodule of the right apex (series 4, image 36). There is a 7 mm subpleural nodule of the anterior right middle lobe, significantly enlarged compared to prior examination, previously 3 mm (series 4, image 114). No pleural effusion or pneumothorax. Musculoskeletal: No chest wall mass or suspicious bone lesions identified. CT ABDOMEN PELVIS FINDINGS Hepatobiliary: Multiple bulky hypodense, rim enhancing masses of the liver, which are in keeping with findings of recent prior MRI and greatly enlarged compared to prior CT dated 11/09/2018. The largest index mass of the central right lobe of the liver measures 9.6 x 9.1 cm, previously 4.2 x 3.9 cm (series 2, image 50). No gallstones, gallbladder wall thickening,  or biliary dilatation. Pancreas: Unremarkable. No pancreatic ductal dilatation or surrounding inflammatory changes. Spleen: Normal in size without significant abnormality. Adrenals/Urinary Tract: Adrenal glands are unremarkable. Kidneys are normal, without renal calculi, solid lesion, or hydronephrosis. Bladder is unremarkable. Stomach/Bowel: Stomach is within normal limits. There is circumferential thickening of the rectum, which appears increased compared to prior examination (series 2, image 25). Large burden of stool throughout the colon. Vascular/Lymphatic: Aortic atherosclerosis. There is a soft tissue nodule adjacent to the superior rectum measuring 1.5 cm, enlarged compared to prior examination, previously 1.0 cm (series 2,  image 97, series 5, image 101). Reproductive: No mass or other abnormality. Other: No abdominal wall hernia or abnormality. No abdominopelvic ascites. Musculoskeletal: No acute or significant osseous findings. IMPRESSION: 1. Multiple bulky hypodense, rim enhancing masses of the liver, which are in keeping with findings of recent prior MRI and greatly enlarged compared to prior CT dated 11/09/2018. The largest index mass of the central right lobe of the liver measures 9.6 x 9.1 cm, previously 4.2 x 3.9 cm (series 2, image 50). Findings are consistent with hepatic metastatic disease. 2. There is circumferential thickening of the rectum, which appears increased compared to prior examination (series 2, image 25), in keeping with primary rectal malignancy. There is a soft tissue nodule adjacent to the superior rectum measuring 1.5 cm, enlarged compared to prior examination, previously 1.0 cm (series 2, image 97, series 5, image 101), consistent with perirectal nodal metastatic disease. 3. There is a 7 mm subpleural nodule of the anterior right middle lobe, significantly enlarged compared to prior examination, previously 3 mm (series 4, image 114). This is presumably pulmonary metastatic disease. 4. Biapical pleuroparenchymal scarring and multiple small nodules of the bilateral lung apices, the largest a 6 mm pulmonary nodule of the right apex (series 4, image 36). These findings are likely sequelae of prior infection or inflammation. Attention on follow-up. 5.  Aortic Atherosclerosis (ICD10-I70.0). Electronically Signed   By: Eddie Candle M.D.   On: 02/01/2019 08:59   MR Abdomen W Wo Contrast  Result Date: 01/25/2019 CLINICAL DATA:  Liver lesion. Blood in stool. EXAM: MRI ABDOMEN WITHOUT AND WITH CONTRAST TECHNIQUE: Multiplanar multisequence MR imaging of the abdomen was performed both before and after the administration of intravenous contrast. CONTRAST:  101mL MULTIHANCE GADOBENATE DIMEGLUMINE 529 MG/ML IV SOLN  COMPARISON:  CT study of 11/09/2018 FINDINGS: Lower chest: Limited assessment of the lung bases is unremarkable. Hepatobiliary: Multiple hepatic masses enlarged since the CT study of 11/09/2018. All segments of the liver are involved except for hepatic subsegment III. Lesions show peripheral enhancement and restricted diffusion with thick irregular walls and areas of central necrosis within larger lesions. Fibrotic changes are presumed to be present based on low T2 signal within the central portion of some of these lesions. (Image 33, series 13): 8.6 x 8.2 cm lesion in hepatic subsegment VIII previously 4.1 x 4.1 cm, lesion shows abundant central necrosis. (Image 55, series 13) lesion in the left hepatic lobe measuring 5.3 by 4.2 cm previously 2.0 by 1.7 cm. All other lesions seen on the prior study have enlarged to a similar extent (Image 46, series 13) 2.7 x 2.9 cm lesion in the left hepatic lobe, medial segment new compared to prior study. Also likely with a new approximately 1 cm size lesion in hepatic subsegment VI. At least 15 lesions in the liver. No signs of biliary ductal dilation. Pancreas: Limited assessment of the pancreas due to imaging which extends only through the mid  left kidney excluding a portion of the uncinate process is unremarkable. Spleen:  No focal splenic lesion. Adrenals/Urinary Tract: Adrenal glands are normal. Kidneys enhance symmetrically. On multiphase imaging only a portion of the kidneys is included in the field of view. Stomach/Bowel: No sign of acute bowel process though MRI with limited assessment of bowel contents. Rectum not visualized. Vascular/Lymphatic:  Vascular structures in the abdomen are patent. Other: No sign of ascites or definitive evidence of peritoneal disease though study is limited by susceptibility from adjacent bowel gas and limited coverage. Musculoskeletal: No visible destructive bone lesions. IMPRESSION: 1. Marked worsening of what is now characterized as  hepatic metastatic disease, in this patient who now is known to have rectal cancer. 2. Insert PRA call report Electronically Signed   By: Zetta Bills M.D.   On: 01/25/2019 17:54   CT Abdomen Pelvis W Contrast  Result Date: 02/01/2019 CLINICAL DATA:  Metastatic rectal cancer EXAM: CT CHEST, ABDOMEN, AND PELVIS WITH CONTRAST TECHNIQUE: Multidetector CT imaging of the chest, abdomen and pelvis was performed following the standard protocol during bolus administration of intravenous contrast. CONTRAST:  130mL ISOVUE-300 IOPAMIDOL (ISOVUE-300) INJECTION 61%, additional oral enteric contrast COMPARISON:  MR abdomen, 01/25/2019, CT abdomen pelvis, 11/09/2018 FINDINGS: CT CHEST FINDINGS Cardiovascular: No significant vascular findings. Normal heart size. No pericardial effusion. Mediastinum/Nodes: No enlarged mediastinal, hilar, or axillary lymph nodes. Thyroid gland, trachea, and esophagus demonstrate no significant findings. Lungs/Pleura: Biapical pleuroparenchymal scarring and multiple small nodules of the bilateral lung apices, the largest a 6 mm pulmonary nodule of the right apex (series 4, image 36). There is a 7 mm subpleural nodule of the anterior right middle lobe, significantly enlarged compared to prior examination, previously 3 mm (series 4, image 114). No pleural effusion or pneumothorax. Musculoskeletal: No chest wall mass or suspicious bone lesions identified. CT ABDOMEN PELVIS FINDINGS Hepatobiliary: Multiple bulky hypodense, rim enhancing masses of the liver, which are in keeping with findings of recent prior MRI and greatly enlarged compared to prior CT dated 11/09/2018. The largest index mass of the central right lobe of the liver measures 9.6 x 9.1 cm, previously 4.2 x 3.9 cm (series 2, image 50). No gallstones, gallbladder wall thickening, or biliary dilatation. Pancreas: Unremarkable. No pancreatic ductal dilatation or surrounding inflammatory changes. Spleen: Normal in size without significant  abnormality. Adrenals/Urinary Tract: Adrenal glands are unremarkable. Kidneys are normal, without renal calculi, solid lesion, or hydronephrosis. Bladder is unremarkable. Stomach/Bowel: Stomach is within normal limits. There is circumferential thickening of the rectum, which appears increased compared to prior examination (series 2, image 25). Large burden of stool throughout the colon. Vascular/Lymphatic: Aortic atherosclerosis. There is a soft tissue nodule adjacent to the superior rectum measuring 1.5 cm, enlarged compared to prior examination, previously 1.0 cm (series 2, image 97, series 5, image 101). Reproductive: No mass or other abnormality. Other: No abdominal wall hernia or abnormality. No abdominopelvic ascites. Musculoskeletal: No acute or significant osseous findings. IMPRESSION: 1. Multiple bulky hypodense, rim enhancing masses of the liver, which are in keeping with findings of recent prior MRI and greatly enlarged compared to prior CT dated 11/09/2018. The largest index mass of the central right lobe of the liver measures 9.6 x 9.1 cm, previously 4.2 x 3.9 cm (series 2, image 50). Findings are consistent with hepatic metastatic disease. 2. There is circumferential thickening of the rectum, which appears increased compared to prior examination (series 2, image 25), in keeping with primary rectal malignancy. There is a soft tissue nodule adjacent to  the superior rectum measuring 1.5 cm, enlarged compared to prior examination, previously 1.0 cm (series 2, image 97, series 5, image 101), consistent with perirectal nodal metastatic disease. 3. There is a 7 mm subpleural nodule of the anterior right middle lobe, significantly enlarged compared to prior examination, previously 3 mm (series 4, image 114). This is presumably pulmonary metastatic disease. 4. Biapical pleuroparenchymal scarring and multiple small nodules of the bilateral lung apices, the largest a 6 mm pulmonary nodule of the right apex  (series 4, image 36). These findings are likely sequelae of prior infection or inflammation. Attention on follow-up. 5.  Aortic Atherosclerosis (ICD10-I70.0). Electronically Signed   By: Eddie Candle M.D.   On: 02/01/2019 08:59    Labs:  CBC: Recent Labs    11/02/18 1654 11/02/18 1655 01/16/19 1615 01/26/19 0839  WBC 5.4  --  7.1 7.4  HGB 13.6 13.4 13.4 13.2  HCT 40.0  --  39.2 39.2  PLT 189  --  254.0 261    COAGS: No results for input(s): INR, APTT in the last 8760 hours.  BMP: Recent Labs    11/02/18 1654 01/16/19 1615 01/26/19 0839  NA 141 138 139  K 4.0 4.2 4.4  CL 103 101 101  CO2 25 30 32  GLUCOSE 101* 116* 111*  BUN 15 10 10   CALCIUM 9.7 9.5 9.7  CREATININE 0.99 0.70 0.70  GFRNONAA 64  --  >60  GFRAA 74  --  >60    LIVER FUNCTION TESTS: Recent Labs    11/02/18 1654 01/16/19 1615 01/26/19 0839  BILITOT 0.3 0.3 0.5  AST 33 47* 49*  ALT 26 54* 38  ALKPHOS 80 197* 190*  PROT 6.7 6.7 6.4*  ALBUMIN 4.7 4.2 4.2    TUMOR MARKERS: No results for input(s): AFPTM, CEA, CA199, CHROMGRNA in the last 8760 hours.  Assessment and Plan: 54 y.o. female with history of recently diagnosed rectal cancer and latest imaging findings revealing:  1. Multiple bulky hypodense, rim enhancing masses of the liver, which are in keeping with findings of recent prior MRI and greatly enlarged compared to prior CT dated 11/09/2018. The largest index mass of the central right lobe of the liver measures 9.6 x 9.1 cm, previously 4.2 x 3.9 cm (series 2, image 50). Findings are consistent with hepatic metastatic disease. 2. There is circumferential thickening of the rectum, which appears increased compared to prior examination (series 2, image 25), in keeping with primary rectal malignancy. There is a soft tissue nodule adjacent to the superior rectum measuring 1.5 cm, enlarged compared to prior examination, previously 1.0 cm (series 2, image 97, series 5, image 101), consistent  with perirectal nodal metastatic disease. 3. There is a 7 mm subpleural nodule of the anterior right middle lobe, significantly enlarged compared to prior examination, previously 3 mm (series 4, image 114). This is presumably pulmonary metastatic disease. 4. Biapical pleuroparenchymal scarring and multiple small nodules of the bilateral lung apices, the largest a 6 mm pulmonary nodule of the right apex (series 4, image 36). These findings are likely sequelae of prior infection or inflammation. Attention on follow-up. 5.  Aortic Atherosclerosis (ICD10-I70.0).  She presents today for image guided liver lesion biopsy for further evaluation.Risks and benefits of procedure was discussed with the patient  including, but not limited to bleeding, infection, damage to adjacent structures or low yield requiring additional tests.  All of the questions were answered and there is agreement to proceed.  Consent signed and in chart.  Thank you for this interesting consult.  I greatly enjoyed meeting Tiondra Heroux and look forward to participating in their care.  A copy of this report was sent to the requesting provider on this date.  Electronically Signed: D. Rowe Robert, PA-C 02/07/2019, 11:40 AM   I spent a total of  25 minutes   in face to face in clinical consultation, greater than 50% of which was counseling/coordinating care for image guided liver lesion biopsy

## 2019-02-07 NOTE — Progress Notes (Signed)
Patients daughter returned call confirming she is patients ride home today. Updated her on possible d/c time.

## 2019-02-07 NOTE — Progress Notes (Signed)
Called patients daughter, Neal Romanchuk, to confirm she is patients ride home. Requested a call back on her voicemail.

## 2019-02-07 NOTE — Discharge Instructions (Signed)
For any questions or concerns call [phone]; for after hours call  [phone] and ask for on call MD  Liver Biopsy, Care After These instructions give you information on caring for yourself after your procedure. Your doctor may also give you more specific instructions. Call your doctor if you have any problems or questions after your procedure. What can I expect after the procedure? After the procedure, it is common to have:  Pain and soreness where the biopsy was done.  Bruising around the area where the biopsy was done.  Sleepiness and be tired for a few days. Follow these instructions at home: Medicines  Take over-the-counter and prescription medicines only as told by your doctor.  If you were prescribed an antibiotic medicine, take it as told by your doctor. Do not stop taking the antibiotic even if you start to feel better.  Do not take medicines such as aspirin and ibuprofen. These medicines can thin your blood. Do not take these medicines unless your doctor tells you to take them.  If you are taking prescription pain medicine, take actions to prevent or treat constipation. Your doctor may recommend that you: ? Drink enough fluid to keep your pee (urine) clear or pale yellow. ? Take over-the-counter or prescription medicines. ? Eat foods that are high in fiber, such as fresh fruits and vegetables, whole grains, and beans. ? Limit foods that are high in fat and processed sugars, such as fried and sweet foods. Caring for your cut  Follow instructions from your doctor about how to take care of your cuts from surgery (incisions). Make sure you: ? Wash your hands with soap and water before you change your bandage (dressing). If you cannot use soap and water, use hand sanitizer. ? Change your bandage as told by your doctor. ? Leave stitches (sutures), skin glue, or skin tape (adhesive) strips in place. They may need to stay in place for 2 weeks or longer. If tape strips get loose and  curl up, you may trim the loose edges. Do not remove tape strips completely unless your doctor says it is okay.  Check your cuts every day for signs of infection. Check for: ? Redness, swelling, or more pain. ? Fluid or blood. ? Pus or a bad smell. ? Warmth.  Do not take baths, swim, or use a hot tub until your doctor says it is okay to do so. Activity   Rest at home for 1-2 days or as told by your doctor. ? Avoid sitting for a long time without moving. Get up to take short walks every 1-2 hours.  Return to your normal activities as told by your doctor. Ask what activities are safe for you.  Do not do these things in the first 24 hours: ? Drive. ? Use machinery. ? Take a bath or shower.  Do not lift more than 10 pounds (4.5 kg) or play contact sports for the first 2 weeks. General instructions   Do not drink alcohol in the first week after the procedure.  Have someone stay with you for at least 24 hours after the procedure.  Get your test results. Ask your doctor or the department that is doing the test: ? When will my results be ready? ? How will I get my results? ? What are my treatment options? ? What other tests do I need? ? What are my next steps?  Keep all follow-up visits as told by your doctor. This is important. Contact a doctor if:  A cut bleeds and leaves more than just a small spot of blood.  A cut is red, puffs up (swells), or hurts more than before.  Fluid or something else comes from a cut.  A cut smells bad.  You have a fever or chills. Get help right away if:  You have swelling, bloating, or pain in your belly (abdomen).  You get dizzy or faint.  You have a rash.  You feel sick to your stomach (nauseous) or throw up (vomit).  You have trouble breathing, feel short of breath, or feel faint.  Your chest hurts.  You have problems talking or seeing.  You have trouble with your balance or moving your arms or legs. Summary  After the  procedure, it is common to have pain, soreness, bruising, and tiredness.  Your doctor will tell you how to take care of yourself at home. Change your bandage, take your medicines, and limit your activities as told by your doctor.  Call your doctor if you have symptoms of infection. Get help right away if your belly swells, your cut bleeds a lot, or you have trouble talking or breathing. This information is not intended to replace advice given to you by your health care provider. Make sure you discuss any questions you have with your health care provider. Document Revised: 01/08/2017 Document Reviewed: 01/08/2017 Elsevier Patient Education  [place].  Moderate Conscious Sedation, Adult, Care After These instructions provide you with information about caring for yourself after your procedure. Your health care provider may also give you more specific instructions. Your treatment has been planned according to current medical practices, but problems sometimes occur. Call your health care provider if you have any problems or questions after your procedure. What can I expect after the procedure? After your procedure, it is common:  To feel sleepy for several hours.  To feel clumsy and have poor balance for several hours.  To have poor judgment for several hours.  To vomit if you eat too soon. Follow these instructions at home: For at least 24 hours after the procedure:   Do not: ? Participate in activities where you could fall or become injured. ? Drive. ? Use heavy machinery. ? Drink alcohol. ? Take sleeping pills or medicines that cause drowsiness. ? Make important decisions or sign legal documents. ? Take care of children on your own.  Rest. Eating and drinking  Follow the diet recommended by your health care provider.  If you vomit: ? Drink water, juice, or soup when you can drink without vomiting. ? Make sure you have little or no nausea before eating solid  foods. General instructions  Have a responsible adult stay with you until you are awake and alert.  Take over-the-counter and prescription medicines only as told by your health care provider.  If you smoke, do not smoke without supervision.  Keep all follow-up visits as told by your health care provider. This is important. Contact a health care provider if:  You keep feeling nauseous or you keep vomiting.  You feel light-headed.  You develop a rash.  You have a fever. Get help right away if:  You have trouble breathing. This information is not intended to replace advice given to you by your health care provider. Make sure you discuss any questions you have with your health care provider. Document Revised: 12/11/2016 Document Reviewed: 04/20/2015 Elsevier Patient Education  2020 [employer].

## 2019-02-07 NOTE — Procedures (Signed)
  Procedure: [place] core bx R liver lesion   EBL:   minimal Complications:  none immediate  See full dictation in [facility].  Dillard Cannon MD Main # [phone] Pager  [phone]

## 2019-02-09 ENCOUNTER — Other Ambulatory Visit: Payer: Self-pay | Admitting: Student

## 2019-02-09 ENCOUNTER — Other Ambulatory Visit: Payer: Self-pay | Admitting: Radiology

## 2019-02-09 ENCOUNTER — Ambulatory Visit (INDEPENDENT_AMBULATORY_CARE_PROVIDER_SITE_OTHER): Payer: 59 | Admitting: Gastroenterology

## 2019-02-09 ENCOUNTER — Inpatient Hospital Stay (HOSPITAL_BASED_OUTPATIENT_CLINIC_OR_DEPARTMENT_OTHER): Payer: 59 | Admitting: Hematology

## 2019-02-09 ENCOUNTER — Other Ambulatory Visit: Payer: Self-pay | Admitting: Hematology

## 2019-02-09 ENCOUNTER — Inpatient Hospital Stay: Payer: 59

## 2019-02-09 ENCOUNTER — Encounter: Payer: Self-pay | Admitting: Gastroenterology

## 2019-02-09 ENCOUNTER — Encounter: Payer: Self-pay | Admitting: Hematology

## 2019-02-09 ENCOUNTER — Other Ambulatory Visit: Payer: Self-pay

## 2019-02-09 ENCOUNTER — Encounter: Payer: Self-pay | Admitting: *Deleted

## 2019-02-09 VITALS — BP 104/64 | HR 77 | Temp 97.1°F | Ht 68.0 in | Wt 128.5 lb

## 2019-02-09 VITALS — BP 99/61 | HR 70 | Temp 97.3°F | Resp 18 | Ht 68.0 in | Wt 127.0 lb

## 2019-02-09 DIAGNOSIS — Z8041 Family history of malignant neoplasm of ovary: Secondary | ICD-10-CM | POA: Diagnosis not present

## 2019-02-09 DIAGNOSIS — C787 Secondary malignant neoplasm of liver and intrahepatic bile duct: Secondary | ICD-10-CM | POA: Diagnosis not present

## 2019-02-09 DIAGNOSIS — C19 Malignant neoplasm of rectosigmoid junction: Secondary | ICD-10-CM | POA: Diagnosis not present

## 2019-02-09 DIAGNOSIS — K5900 Constipation, unspecified: Secondary | ICD-10-CM

## 2019-02-09 DIAGNOSIS — R160 Hepatomegaly, not elsewhere classified: Secondary | ICD-10-CM | POA: Diagnosis not present

## 2019-02-09 DIAGNOSIS — C20 Malignant neoplasm of rectum: Secondary | ICD-10-CM

## 2019-02-09 DIAGNOSIS — R748 Abnormal levels of other serum enzymes: Secondary | ICD-10-CM

## 2019-02-09 DIAGNOSIS — Z7189 Other specified counseling: Secondary | ICD-10-CM

## 2019-02-09 DIAGNOSIS — R7989 Other specified abnormal findings of blood chemistry: Secondary | ICD-10-CM

## 2019-02-09 DIAGNOSIS — G893 Neoplasm related pain (acute) (chronic): Secondary | ICD-10-CM | POA: Diagnosis not present

## 2019-02-09 DIAGNOSIS — Z800 Family history of malignant neoplasm of digestive organs: Secondary | ICD-10-CM | POA: Diagnosis not present

## 2019-02-09 DIAGNOSIS — R918 Other nonspecific abnormal finding of lung field: Secondary | ICD-10-CM | POA: Diagnosis not present

## 2019-02-09 LAB — SURGICAL PATHOLOGY

## 2019-02-09 MED ORDER — LORAZEPAM 0.5 MG PO TABS: 0.5000 mg | ORAL_TABLET | Freq: Four times a day (QID) | ORAL | 0 refills | Status: DC | PRN

## 2019-02-09 MED ORDER — PROCHLORPERAZINE MALEATE 10 MG PO TABS: 10.0000 mg | ORAL_TABLET | Freq: Four times a day (QID) | ORAL | 1 refills | Status: DC | PRN

## 2019-02-09 MED ORDER — DEXAMETHASONE 4 MG PO TABS: 8.0000 mg | ORAL_TABLET | Freq: Every day | ORAL | 1 refills | Status: DC

## 2019-02-09 MED ORDER — ONDANSETRON HCL 8 MG PO TABS: 8.0000 mg | ORAL_TABLET | Freq: Two times a day (BID) | ORAL | 1 refills | Status: DC | PRN

## 2019-02-09 MED ORDER — LIDOCAINE-PRILOCAINE 2.5-2.5 % EX CREA: TOPICAL_CREAM | CUTANEOUS | 3 refills | Status: AC

## 2019-02-09 MED FILL — DEXAMETHASONE 4 MG TABLET: 4 | 15 days supply | Qty: 30 | Fill #0

## 2019-02-09 MED FILL — ONDANSETRON HCL 8 MG TABLET: 8 | 15 days supply | Qty: 30 | Fill #0

## 2019-02-09 MED FILL — LORazepam 0.5 MG TABS: 0.5 | 8 days supply | Qty: 30 | Fill #0

## 2019-02-09 MED FILL — LIDOCAINE-PRILOCAINE CREAM: 2.5-2.5 | 30 days supply | Qty: 30 | Fill #0

## 2019-02-09 MED FILL — PROCHLORPERAZINE 10 MG TAB: 10 | 8 days supply | Qty: 30 | Fill #0

## 2019-02-09 NOTE — Patient Instructions (Signed)
Fluorouracil, 5-FU injection What is this medicine? FLUOROURACIL, 5-FU (flure oh YOOR a sil) is a chemotherapy drug. It slows the growth of cancer cells. This medicine is used to treat many types of cancer like breast cancer, colon or rectal cancer, pancreatic cancer, and stomach cancer. This medicine may be used for other purposes; ask your health care provider or pharmacist if you have questions. COMMON BRAND NAME(S): Adrucil What should I tell my health care provider before I take this medicine? They need to know if you have any of these conditions:  blood disorders  dihydropyrimidine dehydrogenase (DPD) deficiency  infection (especially a virus infection such as chickenpox, cold sores, or herpes)  kidney disease  liver disease  malnourished, poor nutrition  recent or ongoing radiation therapy  an unusual or allergic reaction to fluorouracil, other chemotherapy, other medicines, foods, dyes, or preservatives  pregnant or trying to get pregnant  breast-feeding How should I use this medicine? This drug is given as an infusion or injection into a vein. It is administered in a hospital or clinic by a specially trained health care professional. Talk to your pediatrician regarding the use of this medicine in children. Special care may be needed. Overdosage: If you think you have taken too much of this medicine contact a poison control center or emergency room at once. NOTE: This medicine is only for you. Do not share this medicine with others. What if I miss a dose? It is important not to miss your dose. Call your doctor or health care professional if you are unable to keep an appointment. What may interact with this medicine?  allopurinol  cimetidine  dapsone  digoxin  hydroxyurea  leucovorin  levamisole  medicines for seizures like ethotoin, fosphenytoin, phenytoin  medicines to increase blood counts like filgrastim, pegfilgrastim, sargramostim  medicines that  treat or prevent blood clots like warfarin, enoxaparin, and dalteparin  methotrexate  metronidazole  pyrimethamine  some other chemotherapy drugs like busulfan, cisplatin, estramustine, vinblastine  trimethoprim  trimetrexate  vaccines Talk to your doctor or health care professional before taking any of these medicines:  acetaminophen  aspirin  ibuprofen  ketoprofen  naproxen This list may not describe all possible interactions. Give your health care provider a list of all the medicines, herbs, non-prescription drugs, or dietary supplements you use. Also tell them if you smoke, drink alcohol, or use illegal drugs. Some items may interact with your medicine. What should I watch for while using this medicine? Visit your doctor for checks on your progress. This drug may make you feel generally unwell. This is not uncommon, as chemotherapy can affect healthy cells as well as cancer cells. Report any side effects. Continue your course of treatment even though you feel ill unless your doctor tells you to stop. In some cases, you may be given additional medicines to help with side effects. Follow all directions for their use. Call your doctor or health care professional for advice if you get a fever, chills or sore throat, or other symptoms of a cold or flu. Do not treat yourself. This drug decreases your body's ability to fight infections. Try to avoid being around people who are sick. This medicine may increase your risk to bruise or bleed. Call your doctor or health care professional if you notice any unusual bleeding. Be careful brushing and flossing your teeth or using a toothpick because you may get an infection or bleed more easily. If you have any dental work done, tell your dentist you are  receiving this medicine. Avoid taking products that contain aspirin, acetaminophen, ibuprofen, naproxen, or ketoprofen unless instructed by your doctor. These medicines may hide a fever. Do not  become pregnant while taking this medicine. Women should inform their doctor if they wish to become pregnant or think they might be pregnant. There is a potential for serious side effects to an unborn child. Talk to your health care professional or pharmacist for more information. Do not breast-feed an infant while taking this medicine. Men should inform their doctor if they wish to father a child. This medicine may lower sperm counts. Do not treat diarrhea with over the counter products. Contact your doctor if you have diarrhea that lasts more than 2 days or if it is severe and watery. This medicine can make you more sensitive to the sun. Keep out of the sun. If you cannot avoid being in the sun, wear protective clothing and use sunscreen. Do not use sun lamps or tanning beds/booths. What side effects may I notice from receiving this medicine? Side effects that you should report to your doctor or health care professional as soon as possible:  allergic reactions like skin rash, itching or hives, swelling of the face, lips, or tongue  low blood counts - this medicine may decrease the number of white blood cells, red blood cells and platelets. You may be at increased risk for infections and bleeding.  signs of infection - fever or chills, cough, sore throat, pain or difficulty passing urine  signs of decreased platelets or bleeding - bruising, pinpoint red spots on the skin, black, tarry stools, blood in the urine  signs of decreased red blood cells - unusually weak or tired, fainting spells, lightheadedness  breathing problems  changes in vision  chest pain  mouth sores  nausea and vomiting  pain, swelling, redness at site where injected  pain, tingling, numbness in the hands or feet  redness, swelling, or sores on hands or feet  stomach pain  unusual bleeding Side effects that usually do not require medical attention (report to your doctor or health care professional if they  continue or are bothersome):  changes in finger or toe nails  diarrhea  dry or itchy skin  hair loss  headache  loss of appetite  sensitivity of eyes to the light  stomach upset  unusually teary eyes This list may not describe all possible side effects. Call your doctor for medical advice about side effects. You may report side effects to FDA at 1-800-FDA-1088. Where should I keep my medicine? This drug is given in a hospital or clinic and will not be stored at home. NOTE: This sheet is a summary. It may not cover all possible information. If you have questions about this medicine, talk to your doctor, pharmacist, or health care provider.  2020 Elsevier/Gold Standard (2007-05-04 13:53:16)  Oxaliplatin Injection What is this medicine? OXALIPLATIN (ox AL i PLA tin) is a chemotherapy drug. It targets fast dividing cells, like cancer cells, and causes these cells to die. This medicine is used to treat cancers of the colon and rectum, and many other cancers. This medicine may be used for other purposes; ask your health care provider or pharmacist if you have questions. COMMON BRAND NAME(S): Eloxatin What should I tell my health care provider before I take this medicine? They need to know if you have any of these conditions:  heart disease  history of irregular heartbeat  liver disease  low blood counts, like white cells, platelets,  or red blood cells  lung or breathing disease, like asthma  take medicines that treat or prevent blood clots  tingling of the fingers or toes, or other nerve disorder  an unusual or allergic reaction to oxaliplatin, other chemotherapy, other medicines, foods, dyes, or preservatives  pregnant or trying to get pregnant  breast-feeding How should I use this medicine? This drug is given as an infusion into a vein. It is administered in a hospital or clinic by a specially trained health care professional. Talk to your pediatrician regarding the  use of this medicine in children. Special care may be needed. Overdosage: If you think you have taken too much of this medicine contact a poison control center or emergency room at once. NOTE: This medicine is only for you. Do not share this medicine with others. What if I miss a dose? It is important not to miss a dose. Call your doctor or health care professional if you are unable to keep an appointment. What may interact with this medicine? Do not take this medicine with any of the following medications:  cisapride  dronedarone  pimozide  thioridazine This medicine may also interact with the following medications:  aspirin and aspirin-like medicines  certain medicines that treat or prevent blood clots like warfarin, apixaban, dabigatran, and rivaroxaban  cisplatin  cyclosporine  diuretics  medicines for infection like acyclovir, adefovir, amphotericin B, bacitracin, cidofovir, foscarnet, ganciclovir, gentamicin, pentamidine, vancomycin  NSAIDs, medicines for pain and inflammation, like ibuprofen or naproxen  other medicines that prolong the QT interval (an abnormal heart rhythm)  pamidronate  zoledronic acid This list may not describe all possible interactions. Give your health care provider a list of all the medicines, herbs, non-prescription drugs, or dietary supplements you use. Also tell them if you smoke, drink alcohol, or use illegal drugs. Some items may interact with your medicine. What should I watch for while using this medicine? Your condition will be monitored carefully while you are receiving this medicine. You may need blood work done while you are taking this medicine. This medicine may make you feel generally unwell. This is not uncommon as chemotherapy can affect healthy cells as well as cancer cells. Report any side effects. Continue your course of treatment even though you feel ill unless your healthcare professional tells you to stop. This medicine can  make you more sensitive to cold. Do not drink cold drinks or use ice. Cover exposed skin before coming in contact with cold temperatures or cold objects. When out in cold weather wear warm clothing and cover your mouth and nose to warm the air that goes into your lungs. Tell your doctor if you get sensitive to the cold. Do not become pregnant while taking this medicine or for 9 months after stopping it. Women should inform their health care professional if they wish to become pregnant or think they might be pregnant. Men should not father a child while taking this medicine and for 6 months after stopping it. There is potential for serious side effects to an unborn child. Talk to your health care professional for more information. Do not breast-feed a child while taking this medicine or for 3 months after stopping it. This medicine has caused ovarian failure in some women. This medicine may make it more difficult to get pregnant. Talk to your health care professional if you are concerned about your fertility. This medicine has caused decreased sperm counts in some men. This may make it more difficult to father a  child. Talk to your health care professional if you are concerned about your fertility. This medicine may increase your risk of getting an infection. Call your health care professional for advice if you get a fever, chills, or sore throat, or other symptoms of a cold or flu. Do not treat yourself. Try to avoid being around people who are sick. Avoid taking medicines that contain aspirin, acetaminophen, ibuprofen, naproxen, or ketoprofen unless instructed by your health care professional. These medicines may hide a fever. Be careful brushing or flossing your teeth or using a toothpick because you may get an infection or bleed more easily. If you have any dental work done, tell your dentist you are receiving this medicine. What side effects may I notice from receiving this medicine? Side effects that  you should report to your doctor or health care professional as soon as possible:  allergic reactions like skin rash, itching or hives, swelling of the face, lips, or tongue  breathing problems  cough  low blood counts - this medicine may decrease the number of white blood cells, red blood cells, and platelets. You may be at increased risk for infections and bleeding  nausea, vomiting  pain, redness, or irritation at site where injected  pain, tingling, numbness in the hands or feet  signs and symptoms of bleeding such as bloody or black, tarry stools; red or dark brown urine; spitting up blood or brown material that looks like coffee grounds; red spots on the skin; unusual bruising or bleeding from the eyes, gums, or nose  signs and symptoms of a dangerous change in heartbeat or heart rhythm like chest pain; dizziness; fast, irregular heartbeat; palpitations; feeling faint or lightheaded; falls  signs and symptoms of infection like fever; chills; cough; sore throat; pain or trouble passing urine  signs and symptoms of liver injury like dark yellow or brown urine; general ill feeling or flu-like symptoms; light-colored stools; loss of appetite; nausea; right upper belly pain; unusually weak or tired; yellowing of the eyes or skin  signs and symptoms of low red blood cells or anemia such as unusually weak or tired; feeling faint or lightheaded; falls  signs and symptoms of muscle injury like dark urine; trouble passing urine or change in the amount of urine; unusually weak or tired; muscle pain; back pain Side effects that usually do not require medical attention (report to your doctor or health care professional if they continue or are bothersome):  changes in taste  diarrhea  gas  hair loss  loss of appetite  mouth sores This list may not describe all possible side effects. Call your doctor for medical advice about side effects. You may report side effects to FDA at  1-800-FDA-1088. Where should I keep my medicine? This drug is given in a hospital or clinic and will not be stored at home. NOTE: This sheet is a summary. It may not cover all possible information. If you have questions about this medicine, talk to your doctor, pharmacist, or health care provider.  2020 Elsevier/Gold Standard (2018-05-18 12:20:35)

## 2019-02-09 NOTE — Progress Notes (Signed)
Patient here for follow up appointment and for discussion of treatment. She will start chemo next week.   Chemo Education scheduled for 02/14/19 Initiation of chemo scheduled for 02/14/19  Patient's port placement already scheduled for tomorrow.   She has an MRI also scheduled on this date which will be rescheduled. Message sent to ordering physician's office requesting that MRI be moved to next Friday or following week.  Dr Maylon Peppers requested the MSI status of patient's liver biopsy from 02/07/19. Called and spoke to Dr Vic Ripper. He stated the result was MSI stable. Results given to Dr Maylon Peppers.  Patient brought in [facility] paperwork requesting to be removed from work on 02/14/2019. Gave forms and instructions to Otilio Carpen our Financial Specialist.

## 2019-02-09 NOTE — Progress Notes (Signed)
START ON PATHWAY REGIMEN - Colorectal     A cycle is every 14 days:     Oxaliplatin      Leucovorin      Fluorouracil      Fluorouracil   **Always confirm dose/schedule in your pharmacy ordering system**  Patient Characteristics: Distant Metastases, Nonsurgical Candidate, KRAS/NRAS Mutation Positive/Unknown (BRAF V600 Wild-Type/Unknown), Standard Cytotoxic Therapy, First Line Standard Cytotoxic Therapy, Bevacizumab Ineligible, PS = 0,1 Tumor Location: Rectal Therapeutic Status: Distant Metastases Microsatellite/Mismatch Repair Status: MSS/pMMR BRAF Mutation Status: Awaiting Test Results KRAS/NRAS Mutation Status: Awaiting Test Results Standard Cytotoxic Line of Therapy: First Line Standard Cytotoxic Therapy ECOG Performance Status: 0 Bevacizumab Eligibility: Ineligible Intent of Therapy: Non-Curative / Palliative Intent, Discussed with Patient

## 2019-02-09 NOTE — Progress Notes (Signed)
P  Chief Complaint:    Rectal Cancer, review imaging study results, lower abdominal pain  GI History: 54 year old female with newly diagnosed metastatic rectal Adenocarcinoma, diagnosed in 01/2019.  Initially seen in the GI clinic in 01/2019 with c/o hematochezia.  Expedited evaluation due to previous imaging study from 10/2018 demonstrating multiple liver lesions of unclear etiology.  Evaluation to date: -10/2018: multiple liver masses on CT abdomen, largest 4.6x4.2cm, etiology unclear; no definite primary malignancy -01/20/2019: Colonoscopy: Nontraversable mass in the proximal rectum located 10 cm from anal verge.  Biopsies c/w Rectal Adenocarcinoma.  Internal hemorrhoids -01/20/2019: EGD: Normal -CEA 621 -AST/ALT/ALP 49/38/190, T bili 0.5.  Normal CBC -01/25/2019: MRI liver: Numerous masses involving all segments of the liver, largest measuring 8.6 x 8.2 cm (previously 4.1 x 4.1 cm) with central necrosis -02/01/2019: CT C/A/P: 6 mm pulmonary nodule of right apex, 7 mm subpleural nodule RML.  Multiple bulky hypodense ring-enhancing masses in the liver measuring up to 9.6 x 9.1 cm.  Circumferential thickening of the rectum increased from previous CT with large burden stool.  1.5 cm soft tissue nodule adjacent to rectum -02/07/2019: Ultrasound-guided liver biopsy: Metastatic rectal adenocarcinoma  Follows with Dr. Maylon Peppers (Oncology) and Dr. Johney Maine (Colorectal surgery) with plan for chemotherapy, and no surgical intervention at this time.  She has been additionally referred to the [facility] given family history of colon cancer (grandfather) and leiomyosarcoma (mother).  HPI:    Patient is a 54 y.o. female presenting to the Gastroenterology Clinic for follow-up.  Initially seen by Nicoletta Ba PA-C earlier this month with c/o hematochezia.  Elevated concern given previous imaging study from 10/2018, underwent expedited evaluation with EGD/colonoscopy, additional imaging, all as outlined above.  Has  since been diagnosed with metastatic Rectal Adenocarcinoma.  She continues to have lower abdominal discomfort, mainly associated with episodes of constipation.  Stool consistency has improved since starting MiraLAX.  Taking pain medications as prescribed by Oncology clinic.  She was aware of her recent ultrasound-guided liver biopsy results and imaging studies through my chart.  Has a follow-up appointment with Dr. Maylon Peppers in Oncology later this morning.  Does have nausea, but no emesis.  Otherwise, tolerating p.o. intake without issue.  She presents today with her daughter.   Review of systems:     No chest pain, no SOB, no fevers, no urinary sx   Past Medical History:  Diagnosis Date  . Degenerative disc disease, lumbar    L4-L5 worst, DDD  . Family history of colon cancer   . Family history of uterine cancer    leiomyosarcoma in mother, listed as uterine cancer on death certificate  . Rectal cancer [facility])     Patient's surgical history, family medical history, social history, medications and allergies were all reviewed in Epic    Current Outpatient Medications  Medication Sig Dispense Refill  . AMBULATORY NON FORMULARY MEDICATION 2 tablets 2 (two) times daily. [place]    . HYDROcodone-acetaminophen (NORCO) 5-325 MG tablet Take 1 tablet by mouth every 6 (six) hours as needed for moderate pain. 50 tablet 0  . hydrocortisone (ANUSOL-HC) 2.5 % rectal cream Place 1 application rectally 2 (two) times daily. 30 g 0  . Lysine 500 MG TABS Take 1 tablet by mouth daily.    Marland Kitchen morphine (MS CONTIN) 15 MG 12 hr tablet Take 1 tablet (15 mg total) by mouth every 12 (twelve) hours. 60 tablet 0  . Multiple Vitamin (MULTIVITAMIN WITH MINERALS) TABS tablet Take 1 tablet by mouth  daily.    . polyethylene glycol powder (GLYCOLAX/MIRALAX) 17 GM/SCOOP powder Take 17 g by mouth 2 (two) times daily as needed. 3350 g 1  . senna-docusate (SENNA S) 8.6-50 MG tablet Take 1 tablet by mouth 2 (two) times daily as needed  for mild constipation. 60 tablet 4   No current facility-administered medications for this visit.    Physical Exam:     BP 104/64   Pulse 77   Temp (!) 97.1 F (36.2 C)   Ht 5\' 8"  (1.727 m)   Wt 128 lb 8 oz (58.3 kg)   BMI 19.54 kg/m   GENERAL:  Pleasant female in NAD PSYCH: : Cooperative, normal affect SKIN:  turgor, no lesions seen Musculoskeletal:  Normal muscle tone, normal strength NEURO: Alert and oriented x 3, no focal neurologic deficits   IMPRESSION and PLAN:    1) Rectal Adenocarcinoma with metastasis 2) Lower abdominal discomfort 3) Constipation 4) Elevated alkaline phosphatase  Newly diagnosed metastatic rectal adenocarcinoma.  Lower abdominal symptoms 2/2 obstructing rectal lesion, which was nontraversable during recent colonoscopy.  I reviewed the results of her recent imaging studies along with pathology results from the liver biopsy with the patient and her daughter today.    -Resume senna and MiraLAX as already doing -Resume adequate hydration -Pain control per Oncology service -Has follow-up appoint with Dr. Maylon Peppers in Oncology later this morning with plan to start chemotherapy for palliative intent -Elevated liver enzymes 2/2 hepatic tumor burden, with otherwise normal T bili.  No further work-up needed at this time for that finding  Can follow-up with me in the GI clinic as needed.  I spent 35 minutes of time, including in depth chart review, independent review of results as outlined above, communicating results with the patient directly, face-to-face time with the patient, coordinating care, and ordering studies and medications as appropriate, and documentation.       Lavena Bullion ,DO, FACG 02/09/2019, 8:50 AM

## 2019-02-09 NOTE — Progress Notes (Signed)
[place] OFFICE PROGRESS NOTE  Patient Care Team: Shirley, [place], DO as PCP - General (Family Medicine) Tish Men, MD as Medical Oncologist (Hematology) Cordelia Poche, RN as Oncology Nurse Navigator  HEME/ONC OVERVIEW: 1. Metastatic rectal adenocarcinoma to the liver and likely lungs -10/2018: multiple liver masses on CT abdomen, largest 4.6x4.2cm, etiology unclear; no definite primary malignancy -01/2019:   A large rectal mas obstructing the proximal rectum, ~10cm from the anal verge, circumferential; bx'ed, adenocarcinoma  Numerous masses involving all segments of liver, largest ~8cm, as well as enlarging subcentimeter pulmonary nodules; liver bx showed adenocarcinoma, c/w rectal primary; MSI stable (prelim), molecular studies pending -02/2019 - present: 1st line FOLFOX   ASSESSMENT & PLAN:   Metastatic rectal adenocarcinoma to the liver and likely lungs -I independently reviewed the radiologic images of recent CT CAP, and agree with the findings documented.  In summary, CT extensive showed extensive, enlarging liver metastases, the largest of which measuring nearly 10 cm.  In addition, there were bilateral, subcentimeter pulmonary nodules, suspicious for metastatic disease. -Patient underwent liver biopsy, which showed adenocarcinoma, consistent with primary rectal malignancy -I reviewed the imaging and the pathology results in detail with the patient, as well as NCCN guideline -Unfortunately, the liver biopsy showing metastatic adenocarcinoma from the rectum, and the treatment is for palliative intent only -I discussed the case with pathology, who reported that based on preliminary staining, MMR was proficient, making cytotoxic chemotherapy the preferred choice -Molecular studies, including KRAS, NRAS and BRAF, are pending -Due to the obstructing rectal adenocarcinoma, Avastin is associated with increased risk of perforation,  -We discussed some of the risks,  benefits and side-effects of FOLFOX.   -Some of the short term side-effects included, though not limited to, risk of fatigue, weight loss, tumor lysis syndrome, risk of allergic reactions, pancytopenia, life-threatening infections, need for transfusions of blood products, nausea, vomiting, change in bowel habits, admission to hospital for various reasons, and risks of death.  -Long term side-effects are also discussed including permanent damage to nerve function, chronic fatigue, and rare secondary malignancy including bone marrow disorders.  -The patient is aware that the response rates discussed earlier is not guaranteed.   -After a long discussion, patient made an informed decision to proceed with the prescribed plan of care.  -Pending the molecular studies, we can add Vectibix if KRAS/NRAS is wild-type -Port schedule on 02/10/2019 -We will tentatively plan to start on 02/14/2019   Cancer-related pain -Secondary to rectal adenocarcinoma and extensive liver metastases -Pain relatively well controlled with MS-Cont 71m BID and PRN Norco -Patient was also counseled on the importance of maintaining adequate BMs with laxatives, including MiraLAX  Elevated LFT's  -Likely secondary to extensive liver masses -AST 61 and alk phos 202, stable; ALT and Tbili normal  -We will monitor it for now   Goals of care discussion -I discussed with the patient that in light of the metastatic rectal adenocarcinoma, the goal of treatment is for palliative intent only, not curative -Patient expressed understanding  Orders Placed This Encounter  Procedures  . CBC with Differential (Cancer Center Only)    Standing Status:   Standing    Number of Occurrences:   20    Standing Expiration Date:   02/09/2020  . [place]    Standing Status:   Standing    Number of Occurrences:   20    Standing Expiration Date:   02/09/2020   The total time spent in the appointment was  55 minutes encounter with  patients including review of chart and various tests results, discussions about plan of care and coordination of care plan  All questions were answered. The patient knows to call the clinic with any problems, questions or concerns. No barriers to learning was detected.  Return on 02/14/2019 for Cycle 1 of chemotherapy and clinic appt.   Tish Men, MD 1/28/202110:43 AM  CHIEF COMPLAINT: "I am doing okay"  INTERVAL HISTORY: Ms. Bickley returns clinic for follow-up of metastatic rectal adenocarcinoma.  Patient reports that she has been taking MS-Contin twice a day with adequate control of the abdominal pain.  Her bowel movement is soft on laxatives.  She has occasional nausea, usually with an empty stomach, but it improves with eating.  She denies any other complaint today.  REVIEW OF SYSTEMS:   Constitutional: ( - ) fevers, ( - )  chills , ( - ) night sweats Eyes: ( - ) blurriness of vision, ( - ) double vision, ( - ) watery eyes Ears, nose, mouth, throat, and face: ( - ) mucositis, ( - ) sore throat Respiratory: ( - ) cough, ( - ) dyspnea, ( - ) wheezes Cardiovascular: ( - ) palpitation, ( - ) chest discomfort, ( - ) lower extremity swelling Gastrointestinal:  ( + ) nausea, ( - ) heartburn, ( - ) change in bowel habits Skin: ( - ) abnormal skin rashes Lymphatics: ( - ) new lymphadenopathy, ( - ) easy bruising Neurological: ( - ) numbness, ( - ) tingling, ( - ) new weaknesses Behavioral/Psych: ( - ) mood change, ( - ) new changes  All other systems were reviewed with the patient and are negative.  SUMMARY OF ONCOLOGIC HISTORY: Oncology History  Rectal adenocarcinoma ([place])  11/09/2018 Imaging   CT abdomen/pelvis w/ contrast: IMPRESSION: 1. Diffuse stool throughout the colon. No bowel obstruction. No abscess in the abdomen or pelvis. Appendix absent.   2. Multiple liver masses, likely multiple hemangiomas. Note that these lesions on current examination do not have classic  hemangioma appearance. Given the size and multiplicity of these lesions, further assessment nonemergently would be advisable. In this regard, pre and serial post-contrast MR of the liver would be the optimum imaging study of choice to further evaluate. If there is a contraindication to MR, pre and serial post-contrast CT of the liver would be an alternative for further assessment.   3. 3 mm nodular opacity in the right middle lobe. No follow-up needed if patient is low-risk. Non-contrast chest CT can be considered in 12 months if patient is high-risk. This recommendation follows the consensus statement: Guidelines for Management of Incidental Pulmonary Nodules Detected on CT Images: From the Fleischner Society 2017; Radiology 2017; 284:228-243.   4. No evident renal or ureteral calculus. No hydronephrosis. Urinary bladder wall thickness normal.   5.  Aortic Atherosclerosis (ICD10-I70.0).   01/20/2019 Procedure   Colonoscopy: - Hemorrhoids were found on perianal exam. - A frond-like/villous, fungating and ulcerated completely obstructing large mass was found in the proximal rectum, located approximately 10 cm from the anal verge. The mass was circumferential and non-traversable. Oozing was present. This was extensively biopsied with a cold forceps for histology. Path was sent as rush. Estimated blood loss was minimal. - Non-bleeding internal hemorrhoids were found during retroflexion. The hemorrhoids were large.   01/20/2019 Imaging   EGD: - The examined esophagus was normal. - The entire examined stomach was normal. Biopsies were taken with a cold forceps for Helicobacter pylori  testing. Estimated blood loss was minimal. - The duodenal bulb, first portion of the duodenum and second portion of the duodenum were normal. Biopsies were taken with a cold forceps for histology. Estimated blood loss was minimal.   01/24/2019 Initial Diagnosis   Rectal adenocarcinoma ([place])   01/25/2019  Imaging   MRI abdomen: IMPRESSION: 1. Marked worsening of what is now characterized as hepatic metastatic disease, in this patient who now is known to have rectal cancer.   02/01/2019 Imaging   CT chest: IMPRESSION: 1. Multiple bulky hypodense, rim enhancing masses of the liver, which are in keeping with findings of recent prior MRI and greatly enlarged compared to prior CT dated 11/09/2018. The largest index mass of the central right lobe of the liver measures 9.6 x 9.1 cm, previously 4.2 x 3.9 cm (series 2, image 50). Findings are consistent with hepatic metastatic disease. 2. There is circumferential thickening of the rectum, which appears increased compared to prior examination (series 2, image 25), in keeping with primary rectal malignancy. There is a soft tissue nodule adjacent to the superior rectum measuring 1.5 cm, enlarged compared to prior examination, previously 1.0 cm (series 2, image 97, series 5, image 101), consistent with perirectal nodal metastatic disease. 3. There is a 7 mm subpleural nodule of the anterior right middle lobe, significantly enlarged compared to prior examination, previously 3 mm (series 4, image 114). This is presumably pulmonary metastatic disease. 4. Biapical pleuroparenchymal scarring and multiple small nodules of the bilateral lung apices, the largest a 6 mm pulmonary nodule of the right apex (series 4, image 36). These findings are likely sequelae of prior infection or inflammation. Attention on follow-up. 5.  Aortic Atherosclerosis (ICD10-I70.0).   02/01/2019 Imaging   IMPRESSION: 1. Multiple bulky hypodense, rim enhancing masses of the liver, which are in keeping with findings of recent prior MRI and greatly enlarged compared to prior CT dated 11/09/2018. The largest index mass of the central right lobe of the liver measures 9.6 x 9.1 cm, previously 4.2 x 3.9 cm (series 2, image 50). Findings are consistent with hepatic metastatic  disease. 2. There is circumferential thickening of the rectum, which appears increased compared to prior examination (series 2, image 25), in keeping with primary rectal malignancy. There is a soft tissue nodule adjacent to the superior rectum measuring 1.5 cm, enlarged compared to prior examination, previously 1.0 cm (series 2, image 97, series 5, image 101), consistent with perirectal nodal metastatic disease. 3. There is a 7 mm subpleural nodule of the anterior right middle lobe, significantly enlarged compared to prior examination, previously 3 mm (series 4, image 114). This is presumably pulmonary metastatic disease. 4. Biapical pleuroparenchymal scarring and multiple small nodules of the bilateral lung apices, the largest a 6 mm pulmonary nodule of the right apex (series 4, image 36). These findings are likely sequelae of prior infection or inflammation. Attention on follow-up. 5.  Aortic Atherosclerosis (ICD10-I70.0).   02/07/2019 Pathology Results   FINAL MICROSCOPIC DIAGNOSIS:   A. LIVER, BIOPSY:  - Metastatic adenocarcinoma to the liver, consistent with patient's  clinical history of primary rectal adenocarcinoma.  See comment   02/09/2019 Cancer Staging   Staging form: Colon and Rectum, AJCC 8th Edition - Clinical: Stage Unknown (cTX, cNX, cM1) - Signed by Tish Men, MD on 02/09/2019   02/14/2019 -  Chemotherapy   The patient had palonosetron (ALOXI) injection 0.25 mg, 0.25 mg, Intravenous,  Once, 0 of 4 cycles leucovorin 668 mg in dextrose 5 % 250 mL  infusion, 400 mg/m2 = 668 mg, Intravenous,  Once, 0 of 4 cycles oxaliplatin (ELOXATIN) 140 mg in dextrose 5 % 500 mL chemo infusion, 85 mg/m2 = 140 mg, Intravenous,  Once, 0 of 4 cycles fluorouracil (ADRUCIL) chemo injection 650 mg, 400 mg/m2 = 650 mg, Intravenous,  Once, 0 of 4 cycles fluorouracil (ADRUCIL) 4,000 mg in sodium chloride 0.9 % 70 mL chemo infusion, 2,400 mg/m2 = 4,000 mg, Intravenous, 1 Day/Dose, 0 of 4 cycles   for chemotherapy treatment.      I have reviewed the past medical history, past surgical history, social history and family history with the patient and they are unchanged from previous note.  ALLERGIES:  has No Known Allergies.  MEDICATIONS:  Current Outpatient Medications  Medication Sig Dispense Refill  . AMBULATORY NON FORMULARY MEDICATION 2 tablets 2 (two) times daily. [place]    . HYDROcodone-acetaminophen (NORCO) 5-325 MG tablet Take 1 tablet by mouth every 6 (six) hours as needed for moderate pain. 50 tablet 0  . hydrocortisone (ANUSOL-HC) 2.5 % rectal cream Place 1 application rectally 2 (two) times daily. 30 g 0  . Lysine 500 MG TABS Take 1 tablet by mouth daily.    Marland Kitchen morphine (MS CONTIN) 15 MG 12 hr tablet Take 1 tablet (15 mg total) by mouth every 12 (twelve) hours. 60 tablet 0  . Multiple Vitamin (MULTIVITAMIN WITH MINERALS) TABS tablet Take 1 tablet by mouth daily.    . polyethylene glycol powder (GLYCOLAX/MIRALAX) 17 GM/SCOOP powder Take 17 g by mouth 2 (two) times daily as needed. 3350 g 1  . senna-docusate (SENNA S) 8.6-50 MG tablet Take 1 tablet by mouth 2 (two) times daily as needed for mild constipation. 60 tablet 4  . dexamethasone (DECADRON) 4 MG tablet Take 2 tablets (8 mg total) by mouth daily. Start the day after chemotherapy for 2 days. Take with food. 30 tablet 1  . lidocaine-prilocaine (EMLA) cream Apply to affected area once 30 g 3  . LORazepam (ATIVAN) 0.5 MG tablet Take 1 tablet (0.5 mg total) by mouth every 6 (six) hours as needed (Nausea or vomiting). 30 tablet 0  . ondansetron (ZOFRAN) 8 MG tablet Take 1 tablet (8 mg total) by mouth 2 (two) times daily as needed for refractory nausea / vomiting. Start on day 3 after chemotherapy. 30 tablet 1  . prochlorperazine (COMPAZINE) 10 MG tablet Take 1 tablet (10 mg total) by mouth every 6 (six) hours as needed (Nausea or vomiting). 30 tablet 1   No current facility-administered medications for this visit.     PHYSICAL EXAMINATION: ECOG PERFORMANCE STATUS: 0 - Asymptomatic  Today's Vitals   02/09/19 0935  BP: 99/61  Pulse: 70  Resp: 18  Temp: (!) 97.3 F (36.3 C)  TempSrc: Temporal  SpO2: 100%  Weight: 127 lb (57.6 kg)  Height: 5' 8"  (1.727 m)  PainSc: 3    Body mass index is 19.31 kg/m.  Filed Weights   02/09/19 0935  Weight: 127 lb (57.6 kg)    GENERAL: alert, no distress and comfortable SKIN: skin color, texture, turgor are normal, no rashes or significant lesions EYES: conjunctiva are pink and non-injected, sclera clear OROPHARYNX: no exudate, no erythema; lips, buccal mucosa, and tongue normal  NECK: supple, non-tender LUNGS: clear to auscultation with normal breathing effort HEART: regular rate & rhythm and no murmurs and no lower extremity edema ABDOMEN: soft, non-tender, non-distended, normal bowel sounds Musculoskeletal: no cyanosis of digits and no clubbing  PSYCH: alert & oriented x  3, fluent speech  LABORATORY DATA:  I have reviewed the data as listed    Component Value Date/Time   NA 139 02/07/2019 1145   NA 141 11/02/2018 1654   K 3.6 02/07/2019 1145   CL 100 02/07/2019 1145   CO2 29 02/07/2019 1145   GLUCOSE 98 02/07/2019 1145   BUN 7 02/07/2019 1145   BUN 15 11/02/2018 1654   CREATININE 0.54 02/07/2019 1145   CREATININE 0.70 01/26/2019 0839   CALCIUM 9.4 02/07/2019 1145   PROT 7.3 02/07/2019 1145   PROT 6.7 11/02/2018 1654   ALBUMIN 4.2 02/07/2019 1145   ALBUMIN 4.7 11/02/2018 1654   AST 61 (H) 02/07/2019 1145   AST 49 (H) 01/26/2019 0839   ALT 39 02/07/2019 1145   ALT 38 01/26/2019 0839   ALKPHOS 202 (H) 02/07/2019 1145   BILITOT 1.1 02/07/2019 1145   BILITOT 0.5 01/26/2019 0839   GFRNONAA >60 02/07/2019 1145   GFRNONAA >60 01/26/2019 0839   GFRAA >60 02/07/2019 1145   GFRAA >60 01/26/2019 0839    No results found for: SPEP, UPEP  Lab Results  Component Value Date   WBC 7.5 02/07/2019   NEUTROABS 5.1 02/07/2019   HGB 14.2  02/07/2019   HCT 42.7 02/07/2019   MCV 93.4 02/07/2019   PLT 255 02/07/2019      Chemistry      Component Value Date/Time   NA 139 02/07/2019 1145   NA 141 11/02/2018 1654   K 3.6 02/07/2019 1145   CL 100 02/07/2019 1145   CO2 29 02/07/2019 1145   BUN 7 02/07/2019 1145   BUN 15 11/02/2018 1654   CREATININE 0.54 02/07/2019 1145   CREATININE 0.70 01/26/2019 0839      Component Value Date/Time   CALCIUM 9.4 02/07/2019 1145   ALKPHOS 202 (H) 02/07/2019 1145   AST 61 (H) 02/07/2019 1145   AST 49 (H) 01/26/2019 0839   ALT 39 02/07/2019 1145   ALT 38 01/26/2019 0839   BILITOT 1.1 02/07/2019 1145   BILITOT 0.5 01/26/2019 0839       RADIOGRAPHIC STUDIES: I have personally reviewed the radiological images as listed below and agreed with the findings in the report. CT CHEST W CONTRAST  Result Date: 02/01/2019 CLINICAL DATA:  Metastatic rectal cancer EXAM: CT CHEST, ABDOMEN, AND PELVIS WITH CONTRAST TECHNIQUE: Multidetector CT imaging of the chest, abdomen and pelvis was performed following the standard protocol during bolus administration of intravenous contrast. CONTRAST:  122m ISOVUE-300 IOPAMIDOL (ISOVUE-300) INJECTION 61%, additional oral enteric contrast COMPARISON:  MR abdomen, 01/25/2019, CT abdomen pelvis, 11/09/2018 FINDINGS: CT CHEST FINDINGS Cardiovascular: No significant vascular findings. Normal heart size. No pericardial effusion. Mediastinum/Nodes: No enlarged mediastinal, hilar, or axillary lymph nodes. Thyroid gland, trachea, and esophagus demonstrate no significant findings. Lungs/Pleura: Biapical pleuroparenchymal scarring and multiple small nodules of the bilateral lung apices, the largest a 6 mm pulmonary nodule of the right apex (series 4, image 36). There is a 7 mm subpleural nodule of the anterior right middle lobe, significantly enlarged compared to prior examination, previously 3 mm (series 4, image 114). No pleural effusion or pneumothorax. Musculoskeletal: No  chest wall mass or suspicious bone lesions identified. CT ABDOMEN PELVIS FINDINGS Hepatobiliary: Multiple bulky hypodense, rim enhancing masses of the liver, which are in keeping with findings of recent prior MRI and greatly enlarged compared to prior CT dated 11/09/2018. The largest index mass of the central right lobe of the liver measures 9.6 x 9.1 cm, previously 4.2 x  3.9 cm (series 2, image 50). No gallstones, gallbladder wall thickening, or biliary dilatation. Pancreas: Unremarkable. No pancreatic ductal dilatation or surrounding inflammatory changes. Spleen: Normal in size without significant abnormality. Adrenals/Urinary Tract: Adrenal glands are unremarkable. Kidneys are normal, without renal calculi, solid lesion, or hydronephrosis. Bladder is unremarkable. Stomach/Bowel: Stomach is within normal limits. There is circumferential thickening of the rectum, which appears increased compared to prior examination (series 2, image 25). Large burden of stool throughout the colon. Vascular/Lymphatic: Aortic atherosclerosis. There is a soft tissue nodule adjacent to the superior rectum measuring 1.5 cm, enlarged compared to prior examination, previously 1.0 cm (series 2, image 97, series 5, image 101). Reproductive: No mass or other abnormality. Other: No abdominal wall hernia or abnormality. No abdominopelvic ascites. Musculoskeletal: No acute or significant osseous findings. IMPRESSION: 1. Multiple bulky hypodense, rim enhancing masses of the liver, which are in keeping with findings of recent prior MRI and greatly enlarged compared to prior CT dated 11/09/2018. The largest index mass of the central right lobe of the liver measures 9.6 x 9.1 cm, previously 4.2 x 3.9 cm (series 2, image 50). Findings are consistent with hepatic metastatic disease. 2. There is circumferential thickening of the rectum, which appears increased compared to prior examination (series 2, image 25), in keeping with primary rectal  malignancy. There is a soft tissue nodule adjacent to the superior rectum measuring 1.5 cm, enlarged compared to prior examination, previously 1.0 cm (series 2, image 97, series 5, image 101), consistent with perirectal nodal metastatic disease. 3. There is a 7 mm subpleural nodule of the anterior right middle lobe, significantly enlarged compared to prior examination, previously 3 mm (series 4, image 114). This is presumably pulmonary metastatic disease. 4. Biapical pleuroparenchymal scarring and multiple small nodules of the bilateral lung apices, the largest a 6 mm pulmonary nodule of the right apex (series 4, image 36). These findings are likely sequelae of prior infection or inflammation. Attention on follow-up. 5.  Aortic Atherosclerosis (ICD10-I70.0). Electronically Signed   By: Eddie Candle M.D.   On: 02/01/2019 08:59   MR Abdomen W Wo Contrast  Result Date: 01/25/2019 CLINICAL DATA:  Liver lesion. Blood in stool. EXAM: MRI ABDOMEN WITHOUT AND WITH CONTRAST TECHNIQUE: Multiplanar multisequence MR imaging of the abdomen was performed both before and after the administration of intravenous contrast. CONTRAST:  63m MULTIHANCE GADOBENATE DIMEGLUMINE 529 MG/ML IV SOLN COMPARISON:  CT study of 11/09/2018 FINDINGS: Lower chest: Limited assessment of the lung bases is unremarkable. Hepatobiliary: Multiple hepatic masses enlarged since the CT study of 11/09/2018. All segments of the liver are involved except for hepatic subsegment III. Lesions show peripheral enhancement and restricted diffusion with thick irregular walls and areas of central necrosis within larger lesions. Fibrotic changes are presumed to be present based on low T2 signal within the central portion of some of these lesions. (Image 33, series 13): 8.6 x 8.2 cm lesion in hepatic subsegment VIII previously 4.1 x 4.1 cm, lesion shows abundant central necrosis. (Image 55, series 13) lesion in the left hepatic lobe measuring 5.3 by 4.2 cm previously  2.0 by 1.7 cm. All other lesions seen on the prior study have enlarged to a similar extent (Image 46, series 13) 2.7 x 2.9 cm lesion in the left hepatic lobe, medial segment new compared to prior study. Also likely with a new approximately 1 cm size lesion in hepatic subsegment VI. At least 15 lesions in the liver. No signs of biliary ductal dilation. Pancreas: Limited assessment of  the pancreas due to imaging which extends only through the mid left kidney excluding a portion of the uncinate process is unremarkable. Spleen:  No focal splenic lesion. Adrenals/Urinary Tract: Adrenal glands are normal. Kidneys enhance symmetrically. On multiphase imaging only a portion of the kidneys is included in the field of view. Stomach/Bowel: No sign of acute bowel process though MRI with limited assessment of bowel contents. Rectum not visualized. Vascular/Lymphatic:  Vascular structures in the abdomen are patent. Other: No sign of ascites or definitive evidence of peritoneal disease though study is limited by susceptibility from adjacent bowel gas and limited coverage. Musculoskeletal: No visible destructive bone lesions. IMPRESSION: 1. Marked worsening of what is now characterized as hepatic metastatic disease, in this patient who now is known to have rectal cancer. 2. Insert PRA call report Electronically Signed   By: Zetta Bills M.D.   On: 01/25/2019 17:54   CT Abdomen Pelvis W Contrast  Result Date: 02/01/2019 CLINICAL DATA:  Metastatic rectal cancer EXAM: CT CHEST, ABDOMEN, AND PELVIS WITH CONTRAST TECHNIQUE: Multidetector CT imaging of the chest, abdomen and pelvis was performed following the standard protocol during bolus administration of intravenous contrast. CONTRAST:  122m ISOVUE-300 IOPAMIDOL (ISOVUE-300) INJECTION 61%, additional oral enteric contrast COMPARISON:  MR abdomen, 01/25/2019, CT abdomen pelvis, 11/09/2018 FINDINGS: CT CHEST FINDINGS Cardiovascular: No significant vascular findings. Normal heart  size. No pericardial effusion. Mediastinum/Nodes: No enlarged mediastinal, hilar, or axillary lymph nodes. Thyroid gland, trachea, and esophagus demonstrate no significant findings. Lungs/Pleura: Biapical pleuroparenchymal scarring and multiple small nodules of the bilateral lung apices, the largest a 6 mm pulmonary nodule of the right apex (series 4, image 36). There is a 7 mm subpleural nodule of the anterior right middle lobe, significantly enlarged compared to prior examination, previously 3 mm (series 4, image 114). No pleural effusion or pneumothorax. Musculoskeletal: No chest wall mass or suspicious bone lesions identified. CT ABDOMEN PELVIS FINDINGS Hepatobiliary: Multiple bulky hypodense, rim enhancing masses of the liver, which are in keeping with findings of recent prior MRI and greatly enlarged compared to prior CT dated 11/09/2018. The largest index mass of the central right lobe of the liver measures 9.6 x 9.1 cm, previously 4.2 x 3.9 cm (series 2, image 50). No gallstones, gallbladder wall thickening, or biliary dilatation. Pancreas: Unremarkable. No pancreatic ductal dilatation or surrounding inflammatory changes. Spleen: Normal in size without significant abnormality. Adrenals/Urinary Tract: Adrenal glands are unremarkable. Kidneys are normal, without renal calculi, solid lesion, or hydronephrosis. Bladder is unremarkable. Stomach/Bowel: Stomach is within normal limits. There is circumferential thickening of the rectum, which appears increased compared to prior examination (series 2, image 25). Large burden of stool throughout the colon. Vascular/Lymphatic: Aortic atherosclerosis. There is a soft tissue nodule adjacent to the superior rectum measuring 1.5 cm, enlarged compared to prior examination, previously 1.0 cm (series 2, image 97, series 5, image 101). Reproductive: No mass or other abnormality. Other: No abdominal wall hernia or abnormality. No abdominopelvic ascites. Musculoskeletal: No  acute or significant osseous findings. IMPRESSION: 1. Multiple bulky hypodense, rim enhancing masses of the liver, which are in keeping with findings of recent prior MRI and greatly enlarged compared to prior CT dated 11/09/2018. The largest index mass of the central right lobe of the liver measures 9.6 x 9.1 cm, previously 4.2 x 3.9 cm (series 2, image 50). Findings are consistent with hepatic metastatic disease. 2. There is circumferential thickening of the rectum, which appears increased compared to prior examination (series 2, image 25), in keeping with  primary rectal malignancy. There is a soft tissue nodule adjacent to the superior rectum measuring 1.5 cm, enlarged compared to prior examination, previously 1.0 cm (series 2, image 97, series 5, image 101), consistent with perirectal nodal metastatic disease. 3. There is a 7 mm subpleural nodule of the anterior right middle lobe, significantly enlarged compared to prior examination, previously 3 mm (series 4, image 114). This is presumably pulmonary metastatic disease. 4. Biapical pleuroparenchymal scarring and multiple small nodules of the bilateral lung apices, the largest a 6 mm pulmonary nodule of the right apex (series 4, image 36). These findings are likely sequelae of prior infection or inflammation. Attention on follow-up. 5.  Aortic Atherosclerosis (ICD10-I70.0). Electronically Signed   By: Eddie Candle M.D.   On: 02/01/2019 08:59   [place] CORE BIOPSY (LIVER)  Result Date: 02/07/2019 CLINICAL DATA:  Rectal carcinoma.  Multiple liver lesions. EXAM: ULTRASOUND GUIDED CORE BIOPSY OF LIVER LESION MEDICATIONS: Intravenous Fentanyl 36mg and Versed 153mwere administered as conscious sedation during continuous monitoring of the patient's level of consciousness and physiological / cardiorespiratory status by the radiology RN, with a total moderate sedation time of 10 minutes. PROCEDURE: The procedure, risks, benefits, and alternatives were explained to the  patient. Questions regarding the procedure were encouraged and answered. The patient understands and consents to the procedure. A representative right lobe lesion was localized and appropriate skin entry site determined and marked. The operative field was prepped with chlorhexidine in a sterile fashion, and a sterile drape was applied covering the operative field. A sterile gown and sterile gloves were used for the procedure. Local anesthesia was provided with 1% Lidocaine. Under real-time ultrasound guidance, a 17 gauge trocar needle was advanced to the margin of the lesion. Once needle tip position was confirmed, coaxial 18-gauge core biopsy samples were obtained, submitted in formalin to surgical pathology. The guide needle was removed. Postprocedure scans show no hemorrhage or other apparent complication. The patient tolerated the procedure well. COMPLICATIONS: None. FINDINGS: Multiple large liver lesions were identified corresponding to recent CT findings. Representative core biopsy samples obtained IMPRESSION: 1. Technically successful ultrasound-guided core biopsy, liver lesion. Electronically Signed   By: D Lucrezia Europe.D.   On: 02/07/2019 13:52

## 2019-02-09 NOTE — Patient Instructions (Signed)
Return to office as needed.  It was a pleasure to see you today!  Vito Cirigliano, D.O.

## 2019-02-10 ENCOUNTER — Ambulatory Visit (HOSPITAL_COMMUNITY)
Admission: RE | Admit: 2019-02-10 | Discharge: 2019-02-10 | Disposition: A | Discharge status: Home / Self Care | Payer: 59 | Source: Ambulatory Visit | Attending: Hematology | Admitting: Hematology

## 2019-02-10 ENCOUNTER — Encounter (HOSPITAL_COMMUNITY): Payer: Self-pay

## 2019-02-10 ENCOUNTER — Other Ambulatory Visit: Payer: Self-pay

## 2019-02-10 DIAGNOSIS — Z800 Family history of malignant neoplasm of digestive organs: Secondary | ICD-10-CM | POA: Insufficient documentation

## 2019-02-10 DIAGNOSIS — C20 Malignant neoplasm of rectum: Secondary | ICD-10-CM | POA: Diagnosis not present

## 2019-02-10 DIAGNOSIS — Z79899 Other long term (current) drug therapy: Secondary | ICD-10-CM | POA: Diagnosis not present

## 2019-02-10 DIAGNOSIS — Z5111 Encounter for antineoplastic chemotherapy: Secondary | ICD-10-CM | POA: Diagnosis not present

## 2019-02-10 DIAGNOSIS — K769 Liver disease, unspecified: Secondary | ICD-10-CM | POA: Diagnosis not present

## 2019-02-10 DIAGNOSIS — C189 Malignant neoplasm of colon, unspecified: Secondary | ICD-10-CM | POA: Diagnosis not present

## 2019-02-10 HISTORY — PX: IR IMAGING GUIDED PORT INSERTION: IMG5740

## 2019-02-10 MED ORDER — HEPARIN SOD (PORK) LOCK FLUSH 100 UNIT/ML IV SOLN
INTRAVENOUS | Status: AC
  Filled 2019-02-10: qty 5

## 2019-02-10 MED ORDER — FENTANYL CITRATE (PF) 100 MCG/2ML IJ SOLN
INTRAMUSCULAR | Status: AC
  Filled 2019-02-10: qty 2

## 2019-02-10 MED ORDER — CEFAZOLIN SODIUM-DEXTROSE 2-4 GM/100ML-% IV SOLN: 2.0000 g | Freq: Once | INTRAVENOUS | Status: AC

## 2019-02-10 MED ORDER — FENTANYL CITRATE (PF) 100 MCG/2ML IJ SOLN
INTRAMUSCULAR | Status: AC | PRN
  Administered 2019-02-10 (×2): 50 ug via INTRAVENOUS

## 2019-02-10 MED ORDER — CEFAZOLIN SODIUM-DEXTROSE 2-4 GM/100ML-% IV SOLN
INTRAVENOUS | Status: AC
  Administered 2019-02-10: 10:00:00 2 g via INTRAVENOUS
  Filled 2019-02-10: qty 100

## 2019-02-10 MED ORDER — MIDAZOLAM HCL 2 MG/2ML IJ SOLN
INTRAMUSCULAR | Status: AC
  Filled 2019-02-10: qty 4

## 2019-02-10 MED ORDER — SODIUM CHLORIDE 0.9 % IV SOLN: INTRAVENOUS | Status: DC

## 2019-02-10 MED ORDER — LIDOCAINE-EPINEPHRINE (PF) 1 %-1:200000 IJ SOLN
INTRAMUSCULAR | Status: AC | PRN
  Administered 2019-02-10: 20 mL

## 2019-02-10 MED ORDER — HEPARIN SOD (PORK) LOCK FLUSH 100 UNIT/ML IV SOLN
INTRAVENOUS | Status: AC | PRN
  Administered 2019-02-10: 500 [IU] via INTRAVENOUS

## 2019-02-10 MED ORDER — MIDAZOLAM HCL 2 MG/2ML IJ SOLN
INTRAMUSCULAR | Status: AC | PRN
  Administered 2019-02-10 (×2): 1 mg via INTRAVENOUS

## 2019-02-10 MED ORDER — LIDOCAINE-EPINEPHRINE 1 %-1:100000 IJ SOLN
INTRAMUSCULAR | Status: AC
  Filled 2019-02-10: qty 1

## 2019-02-10 NOTE — Discharge Instructions (Signed)
Implanted Port Home Guide An implanted port is a device that is placed under the skin. It is usually placed in the chest. The device can be used to give IV medicine, to take blood, or for dialysis. You may have an implanted port if:  You need IV medicine that would be irritating to the small veins in your hands or arms.  You need IV medicines, such as antibiotics, for a long period of time.  You need IV nutrition for a long period of time.  You need dialysis. Having a port means that your health care provider will not need to use the veins in your arms for these procedures. You may have fewer limitations when using a port than you would if you used other types of long-term IVs, and you will likely be able to return to normal activities after your incision heals. An implanted port has two main parts:  Reservoir. The reservoir is the part where a needle is inserted to give medicines or draw blood. The reservoir is round. After it is placed, it appears as a small, raised area under your skin.  Catheter. The catheter is a thin, flexible tube that connects the reservoir to a vein. Medicine that is inserted into the reservoir goes into the catheter and then into the vein. How is my port accessed? To access your port:  A numbing cream may be placed on the skin over the port site.  Your health care provider will put on a mask and sterile gloves.  The skin over your port will be cleaned carefully with a germ-killing soap and allowed to dry.  Your health care provider will gently pinch the port and insert a needle into it.  Your health care provider will check for a blood return to make sure the port is in the vein and is not clogged.  If your port needs to remain accessed to get medicine continuously (constant infusion), your health care provider will place a clear bandage (dressing) over the needle site. The dressing and needle will need to be changed every week, or as told by your health care  provider. What is flushing? Flushing helps keep the port from getting clogged. Follow instructions from your health care provider about how and when to flush the port. Ports are usually flushed with saline solution or a medicine called heparin. The need for flushing will depend on how the port is used:  If the port is only used from time to time to give medicines or draw blood, the port may need to be flushed: ? Before and after medicines have been given. ? Before and after blood has been drawn. ? As part of routine maintenance. Flushing may be recommended every 4-6 weeks.  If a constant infusion is running, the port may not need to be flushed.  Throw away any syringes in a disposal container that is meant for sharp items (sharps container). You can buy a sharps container from a pharmacy, or you can make one by using an empty hard plastic bottle with a cover. How long will my port stay implanted? The port can stay in for as long as your health care provider thinks it is needed. When it is time for the port to come out, a surgery will be done to remove it. The surgery will be similar to the procedure that was done to put the port in. Follow these instructions at home:   Flush your port as told by your health care provider.    If you need an infusion over several days, follow instructions from your health care provider about how to take care of your port site. Make sure you: ? Wash your hands with soap and water before you change your dressing. If soap and water are not available, use alcohol-based hand sanitizer. ? Change your dressing as told by your health care provider. ? Place any used dressings or infusion bags into a plastic bag. Throw that bag in the trash. ? Keep the dressing that covers the needle clean and dry. Do not get it wet. ? Do not use scissors or sharp objects near the tube. ? Keep the tube clamped, unless it is being used.  Check your port site every day for signs of  infection. Check for: ? Redness, swelling, or pain. ? Fluid or blood. ? Pus or a bad smell.  Protect the skin around the port site. ? Avoid wearing bra straps that rub or irritate the site. ? Protect the skin around your port from seat belts. Place a soft pad over your chest if needed.  Bathe or shower as told by your health care provider. The site may get wet as long as you are not actively receiving an infusion.  Return to your normal activities as told by your health care provider. Ask your health care provider what activities are safe for you.  Carry a medical alert card or wear a medical alert bracelet at all times. This will let health care providers know that you have an implanted port in case of an emergency. Get help right away if:  You have redness, swelling, or pain at the port site.  You have fluid or blood coming from your port site.  You have pus or a bad smell coming from the port site.  You have a fever. Summary  Implanted ports are usually placed in the chest for long-term IV access.  Follow instructions from your health care provider about flushing the port and changing bandages (dressings).  Take care of the area around your port by avoiding clothing that puts pressure on the area, and by watching for signs of infection.  Protect the skin around your port from seat belts. Place a soft pad over your chest if needed.  Get help right away if you have a fever or you have redness, swelling, pain, drainage, or a bad smell at the port site. This information is not intended to replace advice given to you by your health care provider. Make sure you discuss any questions you have with your health care provider. Document Revised: 04/22/2018 Document Reviewed: 02/01/2016 Elsevier Patient Education  [place] Insertion, Care After This sheet gives you information about how to care for yourself after your procedure. Your health care provider may  also give you more specific instructions. If you have problems or questions, contact your health care provider. What can I expect after the procedure? After the procedure, it is common to have:  Discomfort at the port insertion site.  Bruising on the skin over the port. This should improve over 3-4 days. Follow these instructions at home: [facility] care  After your port is placed, you will get a manufacturer's information card. The card has information about your port. Keep this card with you at all times.  Take care of the port as told by your health care provider. Ask your health care provider if you or a family member can get training for taking care of the  port at home. A home health care nurse may also take care of the port.  Make sure to remember what type of port you have. Incision care  Follow instructions from your health care provider about how to take care of your port insertion site. Make sure you: ? Wash your hands with soap and water before and after you change your bandage (dressing). If soap and water are not available, use hand sanitizer. ? Change your dressing as told by your health care provider. ? Leave stitches (sutures), skin glue, or adhesive strips in place. These skin closures may need to stay in place for 2 weeks or longer. If adhesive strip edges start to loosen and curl up, you may trim the loose edges. Do not remove adhesive strips completely unless your health care provider tells you to do that.  Check your port insertion site every day for signs of infection. Check for: ? Redness, swelling, or pain. ? Fluid or blood. ? Warmth. ? Pus or a bad smell. Activity  Return to your normal activities as told by your health care provider. Ask your health care provider what activities are safe for you.  Do not lift anything that is heavier than 10 lb (4.5 kg), or the limit that you are told, until your health care provider says that it is safe. General  instructions  Take over-the-counter and prescription medicines only as told by your health care provider.  Do not take baths, swim, or use a hot tub until your health care provider approves. Ask your health care provider if you may take showers. You may only be allowed to take sponge baths.  Do not drive for 24 hours if you were given a sedative during your procedure.  Wear a medical alert bracelet in case of an emergency. This will tell any health care providers that you have a port.  Keep all follow-up visits as told by your health care provider. This is important. Contact a health care provider if:  You cannot flush your port with saline as directed, or you cannot draw blood from the port.  You have a fever or chills.  You have redness, swelling, or pain around your port insertion site.  You have fluid or blood coming from your port insertion site.  Your port insertion site feels warm to the touch.  You have pus or a bad smell coming from the port insertion site. Get help right away if:  You have chest pain or shortness of breath.  You have bleeding from your port that you cannot control. Summary  Take care of the port as told by your health care provider. Keep the manufacturer's information card with you at all times.  Change your dressing as told by your health care provider.  Contact a health care provider if you have a fever or chills or if you have redness, swelling, or pain around your port insertion site.  Keep all follow-up visits as told by your health care provider. This information is not intended to replace advice given to you by your health care provider. Make sure you discuss any questions you have with your health care provider. Document Revised: 07/27/2017 Document Reviewed: 07/27/2017 Elsevier Patient Education  [place].   Moderate Conscious Sedation, Adult, Care After These instructions provide you with information about caring for yourself  after your procedure. Your health care provider may also give you more specific instructions. Your treatment has been planned according to current medical practices, but problems  sometimes occur. Call your health care provider if you have any problems or questions after your procedure. What can I expect after the procedure? After your procedure, it is common:  To feel sleepy for several hours.  To feel clumsy and have poor balance for several hours.  To have poor judgment for several hours.  To vomit if you eat too soon. Follow these instructions at home: For at least 24 hours after the procedure:   Do not: ? Participate in activities where you could fall or become injured. ? Drive. ? Use heavy machinery. ? Drink alcohol. ? Take sleeping pills or medicines that cause drowsiness. ? Make important decisions or sign legal documents. ? Take care of children on your own.  Rest. Eating and drinking  Follow the diet recommended by your health care provider.  If you vomit: ? Drink water, juice, or soup when you can drink without vomiting. ? Make sure you have little or no nausea before eating solid foods. General instructions  Have a responsible adult stay with you until you are awake and alert.  Take over-the-counter and prescription medicines only as told by your health care provider.  If you smoke, do not smoke without supervision.  Keep all follow-up visits as told by your health care provider. This is important. Contact a health care provider if:  You keep feeling nauseous or you keep vomiting.  You feel light-headed.  You develop a rash.  You have a fever. Get help right away if:  You have trouble breathing. This information is not intended to replace advice given to you by your health care provider. Make sure you discuss any questions you have with your health care provider. Document Revised: 12/11/2016 Document Reviewed: 04/20/2015 Elsevier Patient Education   2020 [employer].

## 2019-02-10 NOTE — H&P (Signed)
Referring Physician(s): Zhao,Yan  Supervising Physician: Daryll Brod  Patient Status:  [facility] OP  Chief Complaint: "I'm here for a port a cath"   Subjective: Patient familiar to IR service from liver lesion biopsy on 02/07/2019.  She has a history of newly diagnosed metastatic rectal adenocarcinoma and presents again today for Port-A-Cath placement for chemotherapy.  She currently denies fever, headache, chest pain, dyspnea, cough, back pain, nausea, vomiting or bleeding.  She does have some lower abdominal discomfort.  Past Medical History:  Diagnosis Date  . Degenerative disc disease, lumbar    L4-L5 worst, DDD  . Family history of colon cancer   . Family history of uterine cancer    leiomyosarcoma in mother, listed as uterine cancer on death certificate  . Rectal cancer [facility])    Past Surgical History:  Procedure Laterality Date  . APPENDECTOMY  1984       Allergies: Patient has no known allergies.  Medications: Prior to Admission medications   Medication Sig Start Date End Date Taking? Authorizing Provider  hydrocortisone (ANUSOL-HC) 2.5 % rectal cream Place 1 application rectally 2 (two) times daily. 11/02/18  Yes Enid Derry, [place], DO  Lysine 500 MG TABS Take 1 tablet by mouth daily.   Yes [provider]  morphine (MS CONTIN) 15 MG 12 hr tablet Take 1 tablet (15 mg total) by mouth every 12 (twelve) hours. 02/03/19 03/05/19 Yes Tish Men, MD  Multiple Vitamin (MULTIVITAMIN WITH MINERALS) TABS tablet Take 1 tablet by mouth daily.   Yes [provider]  polyethylene glycol powder (GLYCOLAX/MIRALAX) 17 GM/SCOOP powder Take 17 g by mouth 2 (two) times daily as needed. 11/02/18  Yes Enid Derry, [place], DO  senna-docusate (SENNA S) 8.6-50 MG tablet Take 1 tablet by mouth 2 (two) times daily as needed for mild constipation. 01/26/19 02/25/19 Yes Tish Men, MD  AMBULATORY NON FORMULARY MEDICATION 2 tablets 2 (two) times daily. [place]    [provider]    dexamethasone (DECADRON) 4 MG tablet Take 2 tablets (8 mg total) by mouth daily. Start the day after chemotherapy for 2 days. Take with food. 02/09/19   Tish Men, MD  HYDROcodone-acetaminophen (NORCO) 5-325 MG tablet Take 1 tablet by mouth every 6 (six) hours as needed for moderate pain. 01/26/19   Tish Men, MD  lidocaine-prilocaine (EMLA) cream Apply to affected area once 02/09/19   Tish Men, MD  LORazepam (ATIVAN) 0.5 MG tablet Take 1 tablet (0.5 mg total) by mouth every 6 (six) hours as needed (Nausea or vomiting). 02/09/19   Tish Men, MD  ondansetron (ZOFRAN) 8 MG tablet Take 1 tablet (8 mg total) by mouth 2 (two) times daily as needed for refractory nausea / vomiting. Start on day 3 after chemotherapy. 02/09/19   Tish Men, MD  prochlorperazine (COMPAZINE) 10 MG tablet Take 1 tablet (10 mg total) by mouth every 6 (six) hours as needed (Nausea or vomiting). 02/09/19   Tish Men, MD     Vital Signs: BP 101/68 (BP Location: Right Arm)   Pulse 74   Temp 98.1 F (36.7 C) (Oral)   Resp 18   Ht 5\' 8"  (1.727 m)   Wt 127 lb (57.6 kg)   SpO2 99%   BMI 19.31 kg/m   Physical Exam awake, alert.  Chest clear to auscultation bilaterally.  Heart with regular rate and rhythm.  Abdomen soft, positive bowel sounds, mildly tender lower abdominal region to palpation; no lower extremity edema.  Imaging: [place] CORE BIOPSY (LIVER)  Result  Date: 02/07/2019 CLINICAL DATA:  Rectal carcinoma.  Multiple liver lesions. EXAM: ULTRASOUND GUIDED CORE BIOPSY OF LIVER LESION MEDICATIONS: Intravenous Fentanyl 63mcg and Versed 1mg  were administered as conscious sedation during continuous monitoring of the patient's level of consciousness and physiological / cardiorespiratory status by the radiology RN, with a total moderate sedation time of 10 minutes. PROCEDURE: The procedure, risks, benefits, and alternatives were explained to the patient. Questions regarding the procedure were encouraged and answered. The patient  understands and consents to the procedure. A representative right lobe lesion was localized and appropriate skin entry site determined and marked. The operative field was prepped with chlorhexidine in a sterile fashion, and a sterile drape was applied covering the operative field. A sterile gown and sterile gloves were used for the procedure. Local anesthesia was provided with 1% Lidocaine. Under real-time ultrasound guidance, a 17 gauge trocar needle was advanced to the margin of the lesion. Once needle tip position was confirmed, coaxial 18-gauge core biopsy samples were obtained, submitted in formalin to surgical pathology. The guide needle was removed. Postprocedure scans show no hemorrhage or other apparent complication. The patient tolerated the procedure well. COMPLICATIONS: None. FINDINGS: Multiple large liver lesions were identified corresponding to recent CT findings. Representative core biopsy samples obtained IMPRESSION: 1. Technically successful ultrasound-guided core biopsy, liver lesion. Electronically Signed   By: Lucrezia Europe M.D.   On: 02/07/2019 13:52    Labs:  CBC: Recent Labs    11/02/18 1654 11/02/18 1655 01/16/19 1615 01/26/19 0839 02/07/19 1145  WBC 5.4  --  7.1 7.4 7.5  HGB 13.6 13.4 13.4 13.2 14.2  HCT 40.0  --  39.2 39.2 42.7  PLT 189  --  254.0 261 255    COAGS: Recent Labs    02/07/19 1145  INR 1.0    BMP: Recent Labs    11/02/18 1654 01/16/19 1615 01/26/19 0839 02/07/19 1145  NA 141 138 139 139  K 4.0 4.2 4.4 3.6  CL 103 101 101 100  CO2 25 30 32 29  GLUCOSE 101* 116* 111* 98  BUN 15 10 10 7   CALCIUM 9.7 9.5 9.7 9.4  CREATININE 0.99 0.70 0.70 0.54  GFRNONAA 64  --  >60 >60  GFRAA 74  --  >60 >60    LIVER FUNCTION TESTS: Recent Labs    11/02/18 1654 01/16/19 1615 01/26/19 0839 02/07/19 1145  BILITOT 0.3 0.3 0.5 1.1  AST 33 47* 49* 61*  ALT 26 54* 38 39  ALKPHOS 80 197* 190* 202*  PROT 6.7 6.7 6.4* 7.3  ALBUMIN 4.7 4.2 4.2 4.2     Assessment and Plan: Pt with history of newly diagnosed metastatic rectal adenocarcinoma ; presents  today for Port-A-Cath placement for chemotherapy. Risks and benefits of image guided port-a-catheter placement was discussed with the patient including, but not limited to bleeding, infection, pneumothorax, or fibrin sheath development and need for additional procedures.  All of the patient's questions were answered, patient is agreeable to proceed. Consent signed and in chart.    Electronically Signed: D. Rowe Robert, PA-C 02/10/2019, 8:49 AM   I spent a total of 25 minutes  at the the patient's bedside AND on the patient's hospital floor or unit, greater than 50% of which was counseling/coordinating care for Port-A-Cath placement

## 2019-02-10 NOTE — Procedures (Signed)
Met colon ca  S/p RT IJ POWER PORT  TIP SVCRA No comp Stable ebl min Ready for use Full report in pacs

## 2019-02-13 ENCOUNTER — Encounter: Payer: Self-pay | Admitting: *Deleted

## 2019-02-14 ENCOUNTER — Inpatient Hospital Stay: Payer: 59 | Attending: Hematology

## 2019-02-14 ENCOUNTER — Inpatient Hospital Stay: Payer: 59

## 2019-02-14 ENCOUNTER — Other Ambulatory Visit: Payer: Self-pay

## 2019-02-14 ENCOUNTER — Inpatient Hospital Stay (HOSPITAL_BASED_OUTPATIENT_CLINIC_OR_DEPARTMENT_OTHER): Payer: 59 | Admitting: Hematology

## 2019-02-14 ENCOUNTER — Encounter: Payer: Self-pay | Admitting: *Deleted

## 2019-02-14 ENCOUNTER — Encounter: Payer: Self-pay | Admitting: Hematology

## 2019-02-14 ENCOUNTER — Telehealth: Payer: Self-pay | Admitting: Hematology

## 2019-02-14 ENCOUNTER — Other Ambulatory Visit: Payer: 59

## 2019-02-14 VITALS — BP 104/67 | HR 74 | Temp 97.7°F | Wt 128.1 lb

## 2019-02-14 DIAGNOSIS — C787 Secondary malignant neoplasm of liver and intrahepatic bile duct: Secondary | ICD-10-CM | POA: Insufficient documentation

## 2019-02-14 DIAGNOSIS — Z5111 Encounter for antineoplastic chemotherapy: Secondary | ICD-10-CM | POA: Insufficient documentation

## 2019-02-14 DIAGNOSIS — G893 Neoplasm related pain (acute) (chronic): Secondary | ICD-10-CM | POA: Diagnosis not present

## 2019-02-14 DIAGNOSIS — C20 Malignant neoplasm of rectum: Secondary | ICD-10-CM | POA: Insufficient documentation

## 2019-02-14 DIAGNOSIS — Z452 Encounter for adjustment and management of vascular access device: Secondary | ICD-10-CM | POA: Insufficient documentation

## 2019-02-14 DIAGNOSIS — R7989 Other specified abnormal findings of blood chemistry: Secondary | ICD-10-CM | POA: Diagnosis not present

## 2019-02-14 LAB — CMP (CANCER CENTER ONLY)
ALT: 42 U/L (ref 0–44)
AST: 56 U/L — ABNORMAL HIGH (ref 15–41)
Albumin: 4 g/dL (ref 3.5–5.0)
Alkaline Phosphatase: 225 U/L — ABNORMAL HIGH (ref 38–126)
Anion gap: 6 (ref 5–15)
BUN: 10 mg/dL (ref 6–20)
CO2: 31 mmol/L (ref 22–32)
Calcium: 9.2 mg/dL (ref 8.9–10.3)
Chloride: 101 mmol/L (ref 98–111)
Creatinine: 0.58 mg/dL (ref 0.44–1.00)
GFR, Est AFR Am: >60 mL/min (ref >60)
GFR, Estimated: >60 mL/min (ref >60)
Glucose, Bld: 149 mg/dL — ABNORMAL HIGH (ref 70–99)
Potassium: 3.7 mmol/L (ref 3.5–5.1)
Sodium: 138 mmol/L (ref 135–145)
Total Bilirubin: 0.8 mg/dL (ref 0.3–1.2)
Total Protein: 6.4 g/dL — ABNORMAL LOW (ref 6.5–8.1)

## 2019-02-14 LAB — CBC WITH DIFFERENTIAL (CANCER CENTER ONLY)
Abs Immature Granulocytes: 0.03 10*3/uL (ref 0.00–0.07)
Basophils Absolute: 0 10*3/uL (ref 0.0–0.1)
Basophils Relative: 0 %
Eosinophils Absolute: 0.1 10*3/uL (ref 0.0–0.5)
Eosinophils Relative: 2 %
HCT: 37.9 % (ref 36.0–46.0)
Hemoglobin: 12.6 g/dL (ref 12.0–15.0)
Immature Granulocytes: 0 %
Lymphocytes Relative: 13 %
Lymphs Abs: 1 10*3/uL (ref 0.7–4.0)
MCH: 30 pg (ref 26.0–34.0)
MCHC: 33.2 g/dL (ref 30.0–36.0)
MCV: 90.2 fL (ref 80.0–100.0)
Monocytes Absolute: 0.6 10*3/uL (ref 0.1–1.0)
Monocytes Relative: 8 %
Neutro Abs: 5.8 10*3/uL (ref 1.7–7.7)
Neutrophils Relative %: 77 %
Platelet Count: 242 10*3/uL (ref 150–400)
RBC: 4.2 MIL/uL (ref 3.87–5.11)
RDW: 12.4 % (ref 11.5–15.5)
WBC Count: 7.6 10*3/uL (ref 4.0–10.5)
nRBC: 0 % (ref 0.0–0.2)

## 2019-02-14 MED ORDER — OXALIPLATIN CHEMO INJECTION 100 MG/20ML
85.0000 mg/m2 | Freq: Once | INTRAVENOUS | Status: AC
  Administered 2019-02-14: 11:00:00 140 mg via INTRAVENOUS
  Filled 2019-02-14: qty 10

## 2019-02-14 MED ORDER — HEPARIN SOD (PORK) LOCK FLUSH 100 UNIT/ML IV SOLN
500.0000 [IU] | Freq: Once | INTRAVENOUS | Status: DC | PRN
  Filled 2019-02-14: qty 5

## 2019-02-14 MED ORDER — DEXTROSE 5 % IV SOLN
Freq: Once | INTRAVENOUS | Status: AC
  Filled 2019-02-14: qty 250

## 2019-02-14 MED ORDER — PALONOSETRON HCL INJECTION 0.25 MG/5ML
INTRAVENOUS | Status: AC
  Filled 2019-02-14: qty 5

## 2019-02-14 MED ORDER — LEUCOVORIN CALCIUM INJECTION 350 MG
400.0000 mg/m2 | Freq: Once | INTRAVENOUS | Status: AC
  Administered 2019-02-14: 11:00:00 668 mg via INTRAVENOUS
  Filled 2019-02-14: qty 33.4

## 2019-02-14 MED ORDER — FLUOROURACIL CHEMO INJECTION 2.5 GM/50ML
400.0000 mg/m2 | Freq: Once | INTRAVENOUS | Status: AC
  Administered 2019-02-14: 14:00:00 650 mg via INTRAVENOUS
  Filled 2019-02-14: qty 13

## 2019-02-14 MED ORDER — SODIUM CHLORIDE 0.9% FLUSH
10.0000 mL | INTRAVENOUS | Status: DC | PRN
  Filled 2019-02-14: qty 10

## 2019-02-14 MED ORDER — DEXAMETHASONE SODIUM PHOSPHATE 10 MG/ML IJ SOLN
10.0000 mg | Freq: Once | INTRAMUSCULAR | Status: AC
  Administered 2019-02-14: 11:00:00 10 mg via INTRAVENOUS

## 2019-02-14 MED ORDER — DEXAMETHASONE SODIUM PHOSPHATE 10 MG/ML IJ SOLN
INTRAMUSCULAR | Status: AC
  Filled 2019-02-14: qty 1

## 2019-02-14 MED ORDER — SODIUM CHLORIDE 0.9 % IV SOLN
2400.0000 mg/m2 | INTRAVENOUS | Status: DC
  Administered 2019-02-14: 14:00:00 4000 mg via INTRAVENOUS
  Filled 2019-02-14: qty 80

## 2019-02-14 MED ORDER — PALONOSETRON HCL INJECTION 0.25 MG/5ML
0.2500 mg | Freq: Once | INTRAVENOUS | Status: AC
  Administered 2019-02-14: 0.25 mg via INTRAVENOUS

## 2019-02-14 NOTE — Telephone Encounter (Signed)
No change per 2/2 los

## 2019-02-14 NOTE — Progress Notes (Signed)
Reviewed all labs with Dr. Maylon Peppers.  Ok to treat today.

## 2019-02-14 NOTE — Patient Instructions (Signed)
[place] Discharge Instructions for Patients Receiving Chemotherapy  Today you received the following chemotherapy agents Oxaliplation, Leucovorin, Adrucil.  To help prevent nausea and vomiting after your treatment, we encourage you to take your nausea medication as indicated by your MD.   If you develop nausea and vomiting that is not controlled by your nausea medication, call the clinic.   BELOW ARE SYMPTOMS THAT SHOULD BE REPORTED IMMEDIATELY:  *FEVER GREATER THAN 100.5 F  *CHILLS WITH OR WITHOUT FEVER  NAUSEA AND VOMITING THAT IS NOT CONTROLLED WITH YOUR NAUSEA MEDICATION  *UNUSUAL SHORTNESS OF BREATH  *UNUSUAL BRUISING OR BLEEDING  TENDERNESS IN MOUTH AND THROAT WITH OR WITHOUT PRESENCE OF ULCERS  *URINARY PROBLEMS  *BOWEL PROBLEMS  UNUSUAL RASH Items with * indicate a potential emergency and should be followed up as soon as possible.  Feel free to call the clinic should you have any questions or concerns. The clinic phone number is [phone].  Please show the [place] at check-in to the Emergency Department and triage nurse.

## 2019-02-14 NOTE — Progress Notes (Signed)
[facility] OFFICE PROGRESS NOTE  Patient Care Team: Shirley, [place], DO as PCP - General (Family Medicine) Tish Men, MD as Medical Oncologist (Hematology) Cordelia Poche, RN as Oncology Nurse Navigator  HEME/ONC OVERVIEW: 1. Metastatic rectal adenocarcinoma to the liver and likely lungs; MMR proficient, molecular studies pending  -10/2018: multiple liver masses on CT abdomen, largest 4.6x4.2cm, suggestive of hemangiomas; no definite primary malignancy;  -01/2019:   A large rectal mass obstructing the proximal rectum, ~10cm from the anal verge, circumferential; bx'ed, adenocarcinoma  Numerous masses involving all segments of liver, largest ~8cm, as well as enlarging subcentimeter pulmonary nodules  Liver bx showed adenocarcinoma, c/w rectal primary; MSI stable, molecular studies pending -02/2019 - present: 1st line FOLFOX   TREATMENT SUMMARY:  02/14/2019 - present: 1st line FOLFOX   ASSESSMENT & PLAN:   Metastatic rectal adenocarcinoma to the liver and likely lungs; MMR proficient -I reinforced the rationale for chemotherapy, as well as some of the potential toxicities  -Labs adequate, proceed with Cycle 1 of FOLFOX -Molecular studies still pending; if KRAS/NRAS is wild-type, then we can Vectibix -Avastin not preferred due to the near complete obstruction by the rectal malignancy  -We will plan to repeat scans after 3-4 cycles of chemotherapy to assess disease response   Cancer-related pain -Secondary to rectal adenocarcinoma and extensive liver metastases -Pain relatively well controlled with MS-Cont 39m BID and PRN Norco -Patient was also counseled on the importance of maintaining adequate BMs with laxatives, including MiraLAX  Elevated LFT's  -Likely secondary to extensive liver masses -AST 56 and alk phos 225, stable; ALT and Tbili normal  -We will monitor it for now   No orders of the defined types were placed in this encounter.  All questions were  answered. The patient knows to call the clinic with any problems, questions or concerns. No barriers to learning was detected.  Return in 2 weeks for Cycle 2 of chemotherapy.   YTish Men MD 2/2/202110:06 AM  CHIEF COMPLAINT: "I am doing okay"  INTERVAL HISTORY: Ms. SKunsmanreturns clinic for follow-up of metastatic rectal adenocarcinoma on FOLFOX.  Patient met with the chemotherapy educator this morning, and I discussed the rationale for chemotherapy as well as some of the potential side effects in detail.  She reports that her abdominal pain is relatively well controlled with MS-Contin twice a day.  She has not required Norco for breakthrough pain.  Her bowel movements are soft.  She denies any other complaint today.  REVIEW OF SYSTEMS:   Constitutional: ( - ) fevers, ( - )  chills , ( - ) night sweats Eyes: ( - ) blurriness of vision, ( - ) double vision, ( - ) watery eyes Ears, nose, mouth, throat, and face: ( - ) mucositis, ( - ) sore throat Respiratory: ( - ) cough, ( - ) dyspnea, ( - ) wheezes Cardiovascular: ( - ) palpitation, ( - ) chest discomfort, ( - ) lower extremity swelling Gastrointestinal:  ( - ) nausea, ( - ) heartburn, ( - ) change in bowel habits Skin: ( - ) abnormal skin rashes Lymphatics: ( - ) new lymphadenopathy, ( - ) easy bruising Neurological: ( - ) numbness, ( - ) tingling, ( - ) new weaknesses Behavioral/Psych: ( - ) mood change, ( - ) new changes  All other systems were reviewed with the patient and are negative.  SUMMARY OF ONCOLOGIC HISTORY: Oncology History  Rectal adenocarcinoma ([place]  11/09/2018 Imaging   CT abdomen/pelvis  w/ contrast: IMPRESSION: 1. Diffuse stool throughout the colon. No bowel obstruction. No abscess in the abdomen or pelvis. Appendix absent.   2. Multiple liver masses, likely multiple hemangiomas. Note that these lesions on current examination do not have classic hemangioma appearance. Given the size and multiplicity of these  lesions, further assessment nonemergently would be advisable. In this regard, pre and serial post-contrast MR of the liver would be the optimum imaging study of choice to further evaluate. If there is a contraindication to MR, pre and serial post-contrast CT of the liver would be an alternative for further assessment.   3. 3 mm nodular opacity in the right middle lobe. No follow-up needed if patient is low-risk. Non-contrast chest CT can be considered in 12 months if patient is high-risk. This recommendation follows the consensus statement: Guidelines for Management of Incidental Pulmonary Nodules Detected on CT Images: From the Fleischner Society 2017; Radiology 2017; 284:228-243.   4. No evident renal or ureteral calculus. No hydronephrosis. Urinary bladder wall thickness normal.   5.  Aortic Atherosclerosis (ICD10-I70.0).   01/20/2019 Procedure   Colonoscopy: - Hemorrhoids were found on perianal exam. - A frond-like/villous, fungating and ulcerated completely obstructing large mass was found in the proximal rectum, located approximately 10 cm from the anal verge. The mass was circumferential and non-traversable. Oozing was present. This was extensively biopsied with a cold forceps for histology. Path was sent as rush. Estimated blood loss was minimal. - Non-bleeding internal hemorrhoids were found during retroflexion. The hemorrhoids were large.   01/20/2019 Imaging   EGD: - The examined esophagus was normal. - The entire examined stomach was normal. Biopsies were taken with a cold forceps for Helicobacter pylori testing. Estimated blood loss was minimal. - The duodenal bulb, first portion of the duodenum and second portion of the duodenum were normal. Biopsies were taken with a cold forceps for histology. Estimated blood loss was minimal.   01/24/2019 Initial Diagnosis   Rectal adenocarcinoma ([place])   01/25/2019 Imaging   MRI abdomen: IMPRESSION: 1. Marked worsening of what  is now characterized as hepatic metastatic disease, in this patient who now is known to have rectal cancer.   02/01/2019 Imaging   CT chest: IMPRESSION: 1. Multiple bulky hypodense, rim enhancing masses of the liver, which are in keeping with findings of recent prior MRI and greatly enlarged compared to prior CT dated 11/09/2018. The largest index mass of the central right lobe of the liver measures 9.6 x 9.1 cm, previously 4.2 x 3.9 cm (series 2, image 50). Findings are consistent with hepatic metastatic disease. 2. There is circumferential thickening of the rectum, which appears increased compared to prior examination (series 2, image 25), in keeping with primary rectal malignancy. There is a soft tissue nodule adjacent to the superior rectum measuring 1.5 cm, enlarged compared to prior examination, previously 1.0 cm (series 2, image 97, series 5, image 101), consistent with perirectal nodal metastatic disease. 3. There is a 7 mm subpleural nodule of the anterior right middle lobe, significantly enlarged compared to prior examination, previously 3 mm (series 4, image 114). This is presumably pulmonary metastatic disease. 4. Biapical pleuroparenchymal scarring and multiple small nodules of the bilateral lung apices, the largest a 6 mm pulmonary nodule of the right apex (series 4, image 36). These findings are likely sequelae of prior infection or inflammation. Attention on follow-up. 5.  Aortic Atherosclerosis (ICD10-I70.0).   02/01/2019 Imaging   IMPRESSION: 1. Multiple bulky hypodense, rim enhancing masses of the liver, which  are in keeping with findings of recent prior MRI and greatly enlarged compared to prior CT dated 11/09/2018. The largest index mass of the central right lobe of the liver measures 9.6 x 9.1 cm, previously 4.2 x 3.9 cm (series 2, image 50). Findings are consistent with hepatic metastatic disease. 2. There is circumferential thickening of the rectum, which  appears increased compared to prior examination (series 2, image 25), in keeping with primary rectal malignancy. There is a soft tissue nodule adjacent to the superior rectum measuring 1.5 cm, enlarged compared to prior examination, previously 1.0 cm (series 2, image 97, series 5, image 101), consistent with perirectal nodal metastatic disease. 3. There is a 7 mm subpleural nodule of the anterior right middle lobe, significantly enlarged compared to prior examination, previously 3 mm (series 4, image 114). This is presumably pulmonary metastatic disease. 4. Biapical pleuroparenchymal scarring and multiple small nodules of the bilateral lung apices, the largest a 6 mm pulmonary nodule of the right apex (series 4, image 36). These findings are likely sequelae of prior infection or inflammation. Attention on follow-up. 5.  Aortic Atherosclerosis (ICD10-I70.0).   02/07/2019 Pathology Results   FINAL MICROSCOPIC DIAGNOSIS:   A. LIVER, BIOPSY:  - Metastatic adenocarcinoma to the liver, consistent with patient's  clinical history of primary rectal adenocarcinoma.  See comment   02/09/2019 Cancer Staging   Staging form: Colon and Rectum, AJCC 8th Edition - Clinical: Stage Unknown (cTX, cNX, cM1) - Signed by Tish Men, MD on 02/09/2019   02/14/2019 -  Chemotherapy   The patient had palonosetron (ALOXI) injection 0.25 mg, 0.25 mg, Intravenous,  Once, 1 of 6 cycles leucovorin 668 mg in dextrose 5 % 250 mL infusion, 400 mg/m2 = 668 mg, Intravenous,  Once, 1 of 6 cycles oxaliplatin (ELOXATIN) 140 mg in dextrose 5 % 500 mL chemo infusion, 85 mg/m2 = 140 mg, Intravenous,  Once, 1 of 6 cycles fluorouracil (ADRUCIL) chemo injection 650 mg, 400 mg/m2 = 650 mg, Intravenous,  Once, 1 of 6 cycles fluorouracil (ADRUCIL) 4,000 mg in sodium chloride 0.9 % 70 mL chemo infusion, 2,400 mg/m2 = 4,000 mg, Intravenous, 1 Day/Dose, 1 of 6 cycles  for chemotherapy treatment.      I have reviewed the past medical  history, past surgical history, social history and family history with the patient and they are unchanged from previous note.  ALLERGIES:  has No Known Allergies.  MEDICATIONS:  Current Outpatient Medications  Medication Sig Dispense Refill  . AMBULATORY NON FORMULARY MEDICATION 2 tablets 2 (two) times daily. [place]    . dexamethasone (DECADRON) 4 MG tablet Take 2 tablets (8 mg total) by mouth daily. Start the day after chemotherapy for 2 days. Take with food. 30 tablet 1  . HYDROcodone-acetaminophen (NORCO) 5-325 MG tablet Take 1 tablet by mouth every 6 (six) hours as needed for moderate pain. 50 tablet 0  . hydrocortisone (ANUSOL-HC) 2.5 % rectal cream Place 1 application rectally 2 (two) times daily. 30 g 0  . LORazepam (ATIVAN) 0.5 MG tablet Take 1 tablet (0.5 mg total) by mouth every 6 (six) hours as needed (Nausea or vomiting). 30 tablet 0  . Lysine 500 MG TABS Take 1 tablet by mouth daily.    Marland Kitchen morphine (MS CONTIN) 15 MG 12 hr tablet Take 1 tablet (15 mg total) by mouth every 12 (twelve) hours. 60 tablet 0  . Multiple Vitamin (MULTIVITAMIN WITH MINERALS) TABS tablet Take 1 tablet by mouth daily.    . ondansetron (ZOFRAN) 8  MG tablet Take 1 tablet (8 mg total) by mouth 2 (two) times daily as needed for refractory nausea / vomiting. Start on day 3 after chemotherapy. 30 tablet 1  . polyethylene glycol powder (GLYCOLAX/MIRALAX) 17 GM/SCOOP powder Take 17 g by mouth 2 (two) times daily as needed. 3350 g 1  . prochlorperazine (COMPAZINE) 10 MG tablet Take 1 tablet (10 mg total) by mouth every 6 (six) hours as needed (Nausea or vomiting). 30 tablet 1  . senna-docusate (SENNA S) 8.6-50 MG tablet Take 1 tablet by mouth 2 (two) times daily as needed for mild constipation. 60 tablet 4  . lidocaine-prilocaine (EMLA) cream Apply to affected area once 30 g 3   No current facility-administered medications for this visit.   Facility-Administered Medications Ordered in Other Visits  Medication Dose  Route Frequency Provider Last Rate Last Admin  . dexamethasone (DECADRON) injection 10 mg  10 mg Intravenous Once Tish Men, MD      . dextrose 5 % solution   Intravenous Once Tish Men, MD      . dextrose 5 % solution   Intravenous Once Tish Men, MD      . fluorouracil (ADRUCIL) 4,000 mg in sodium chloride 0.9 % 70 mL chemo infusion  2,400 mg/m2 (Treatment Plan Recorded) Intravenous 1 day or 1 dose Tish Men, MD      . fluorouracil (ADRUCIL) chemo injection 650 mg  400 mg/m2 (Treatment Plan Recorded) Intravenous Once Tish Men, MD      . heparin lock flush 100 unit/mL  500 Units Intracatheter Once PRN Tish Men, MD      . leucovorin 668 mg in dextrose 5 % 250 mL infusion  400 mg/m2 (Treatment Plan Recorded) Intravenous Once Tish Men, MD      . oxaliplatin (ELOXATIN) 140 mg in dextrose 5 % 500 mL chemo infusion  85 mg/m2 (Treatment Plan Recorded) Intravenous Once Tish Men, MD      . palonosetron (ALOXI) injection 0.25 mg  0.25 mg Intravenous Once Tish Men, MD      . sodium chloride flush (NS) 0.9 % injection 10 mL  10 mL Intracatheter PRN Tish Men, MD        PHYSICAL EXAMINATION: ECOG PERFORMANCE STATUS: 0 - Asymptomatic  Today's Vitals   02/14/19 0900 02/14/19 0957  BP:  104/67  Pulse:  74  Temp:  97.7 F (36.5 C)  TempSrc:  Oral  SpO2:  99%  Weight:  128 lb 1.9 oz (58.1 kg)  PainSc: 0-No pain 0-No pain   Body mass index is 19.48 kg/m.  Filed Weights   02/14/19 0957  Weight: 128 lb 1.9 oz (58.1 kg)    GENERAL: alert, no distress and comfortable SKIN: skin color, texture, turgor are normal, no rashes or significant lesions EYES: conjunctiva are pink and non-injected, sclera clear OROPHARYNX: no exudate, no erythema; lips, buccal mucosa, and tongue normal  NECK: supple, non-tender LUNGS: clear to auscultation with normal breathing effort HEART: regular rate & rhythm and no murmurs and no lower extremity edema ABDOMEN: soft, non-tender, non-distended, normal bowel  sounds Musculoskeletal: no cyanosis of digits and no clubbing  PSYCH: alert & oriented x 3, fluent speech  LABORATORY DATA:  I have reviewed the data as listed    Component Value Date/Time   NA 138 02/14/2019 0915   NA 141 11/02/2018 1654   K 3.7 02/14/2019 0915   CL 101 02/14/2019 0915   CO2 31 02/14/2019 0915   GLUCOSE 149 (H) 02/14/2019 0915  BUN 10 02/14/2019 0915   BUN 15 11/02/2018 1654   CREATININE 0.58 02/14/2019 0915   CALCIUM 9.2 02/14/2019 0915   PROT 6.4 (L) 02/14/2019 0915   PROT 6.7 11/02/2018 1654   ALBUMIN 4.0 02/14/2019 0915   ALBUMIN 4.7 11/02/2018 1654   AST 56 (H) 02/14/2019 0915   ALT 42 02/14/2019 0915   ALKPHOS 225 (H) 02/14/2019 0915   BILITOT 0.8 02/14/2019 0915   GFRNONAA >60 02/14/2019 0915   GFRAA >60 02/14/2019 0915    No results found for: SPEP, UPEP  Lab Results  Component Value Date   WBC 7.6 02/14/2019   NEUTROABS 5.8 02/14/2019   HGB 12.6 02/14/2019   HCT 37.9 02/14/2019   MCV 90.2 02/14/2019   PLT 242 02/14/2019      Chemistry      Component Value Date/Time   NA 138 02/14/2019 0915   NA 141 11/02/2018 1654   K 3.7 02/14/2019 0915   CL 101 02/14/2019 0915   CO2 31 02/14/2019 0915   BUN 10 02/14/2019 0915   BUN 15 11/02/2018 1654   CREATININE 0.58 02/14/2019 0915      Component Value Date/Time   CALCIUM 9.2 02/14/2019 0915   ALKPHOS 225 (H) 02/14/2019 0915   AST 56 (H) 02/14/2019 0915   ALT 42 02/14/2019 0915   BILITOT 0.8 02/14/2019 0915       RADIOGRAPHIC STUDIES: I have personally reviewed the radiological images as listed below and agreed with the findings in the report. CT CHEST W CONTRAST  Result Date: 02/01/2019 CLINICAL DATA:  Metastatic rectal cancer EXAM: CT CHEST, ABDOMEN, AND PELVIS WITH CONTRAST TECHNIQUE: Multidetector CT imaging of the chest, abdomen and pelvis was performed following the standard protocol during bolus administration of intravenous contrast. CONTRAST:  199m ISOVUE-300 IOPAMIDOL  (ISOVUE-300) INJECTION 61%, additional oral enteric contrast COMPARISON:  MR abdomen, 01/25/2019, CT abdomen pelvis, 11/09/2018 FINDINGS: CT CHEST FINDINGS Cardiovascular: No significant vascular findings. Normal heart size. No pericardial effusion. Mediastinum/Nodes: No enlarged mediastinal, hilar, or axillary lymph nodes. Thyroid gland, trachea, and esophagus demonstrate no significant findings. Lungs/Pleura: Biapical pleuroparenchymal scarring and multiple small nodules of the bilateral lung apices, the largest a 6 mm pulmonary nodule of the right apex (series 4, image 36). There is a 7 mm subpleural nodule of the anterior right middle lobe, significantly enlarged compared to prior examination, previously 3 mm (series 4, image 114). No pleural effusion or pneumothorax. Musculoskeletal: No chest wall mass or suspicious bone lesions identified. CT ABDOMEN PELVIS FINDINGS Hepatobiliary: Multiple bulky hypodense, rim enhancing masses of the liver, which are in keeping with findings of recent prior MRI and greatly enlarged compared to prior CT dated 11/09/2018. The largest index mass of the central right lobe of the liver measures 9.6 x 9.1 cm, previously 4.2 x 3.9 cm (series 2, image 50). No gallstones, gallbladder wall thickening, or biliary dilatation. Pancreas: Unremarkable. No pancreatic ductal dilatation or surrounding inflammatory changes. Spleen: Normal in size without significant abnormality. Adrenals/Urinary Tract: Adrenal glands are unremarkable. Kidneys are normal, without renal calculi, solid lesion, or hydronephrosis. Bladder is unremarkable. Stomach/Bowel: Stomach is within normal limits. There is circumferential thickening of the rectum, which appears increased compared to prior examination (series 2, image 25). Large burden of stool throughout the colon. Vascular/Lymphatic: Aortic atherosclerosis. There is a soft tissue nodule adjacent to the superior rectum measuring 1.5 cm, enlarged compared to  prior examination, previously 1.0 cm (series 2, image 97, series 5, image 101). Reproductive: No mass or other  abnormality. Other: No abdominal wall hernia or abnormality. No abdominopelvic ascites. Musculoskeletal: No acute or significant osseous findings. IMPRESSION: 1. Multiple bulky hypodense, rim enhancing masses of the liver, which are in keeping with findings of recent prior MRI and greatly enlarged compared to prior CT dated 11/09/2018. The largest index mass of the central right lobe of the liver measures 9.6 x 9.1 cm, previously 4.2 x 3.9 cm (series 2, image 50). Findings are consistent with hepatic metastatic disease. 2. There is circumferential thickening of the rectum, which appears increased compared to prior examination (series 2, image 25), in keeping with primary rectal malignancy. There is a soft tissue nodule adjacent to the superior rectum measuring 1.5 cm, enlarged compared to prior examination, previously 1.0 cm (series 2, image 97, series 5, image 101), consistent with perirectal nodal metastatic disease. 3. There is a 7 mm subpleural nodule of the anterior right middle lobe, significantly enlarged compared to prior examination, previously 3 mm (series 4, image 114). This is presumably pulmonary metastatic disease. 4. Biapical pleuroparenchymal scarring and multiple small nodules of the bilateral lung apices, the largest a 6 mm pulmonary nodule of the right apex (series 4, image 36). These findings are likely sequelae of prior infection or inflammation. Attention on follow-up. 5.  Aortic Atherosclerosis (ICD10-I70.0). Electronically Signed   By: Eddie Candle M.D.   On: 02/01/2019 08:59   MR Abdomen W Wo Contrast  Result Date: 01/25/2019 CLINICAL DATA:  Liver lesion. Blood in stool. EXAM: MRI ABDOMEN WITHOUT AND WITH CONTRAST TECHNIQUE: Multiplanar multisequence MR imaging of the abdomen was performed both before and after the administration of intravenous contrast. CONTRAST:  49m  MULTIHANCE GADOBENATE DIMEGLUMINE 529 MG/ML IV SOLN COMPARISON:  CT study of 11/09/2018 FINDINGS: Lower chest: Limited assessment of the lung bases is unremarkable. Hepatobiliary: Multiple hepatic masses enlarged since the CT study of 11/09/2018. All segments of the liver are involved except for hepatic subsegment III. Lesions show peripheral enhancement and restricted diffusion with thick irregular walls and areas of central necrosis within larger lesions. Fibrotic changes are presumed to be present based on low T2 signal within the central portion of some of these lesions. (Image 33, series 13): 8.6 x 8.2 cm lesion in hepatic subsegment VIII previously 4.1 x 4.1 cm, lesion shows abundant central necrosis. (Image 55, series 13) lesion in the left hepatic lobe measuring 5.3 by 4.2 cm previously 2.0 by 1.7 cm. All other lesions seen on the prior study have enlarged to a similar extent (Image 46, series 13) 2.7 x 2.9 cm lesion in the left hepatic lobe, medial segment new compared to prior study. Also likely with a new approximately 1 cm size lesion in hepatic subsegment VI. At least 15 lesions in the liver. No signs of biliary ductal dilation. Pancreas: Limited assessment of the pancreas due to imaging which extends only through the mid left kidney excluding a portion of the uncinate process is unremarkable. Spleen:  No focal splenic lesion. Adrenals/Urinary Tract: Adrenal glands are normal. Kidneys enhance symmetrically. On multiphase imaging only a portion of the kidneys is included in the field of view. Stomach/Bowel: No sign of acute bowel process though MRI with limited assessment of bowel contents. Rectum not visualized. Vascular/Lymphatic:  Vascular structures in the abdomen are patent. Other: No sign of ascites or definitive evidence of peritoneal disease though study is limited by susceptibility from adjacent bowel gas and limited coverage. Musculoskeletal: No visible destructive bone lesions. IMPRESSION: 1.  Marked worsening of what is now characterized  as hepatic metastatic disease, in this patient who now is known to have rectal cancer. 2. Insert PRA call report Electronically Signed   By: Zetta Bills M.D.   On: 01/25/2019 17:54   CT Abdomen Pelvis W Contrast  Result Date: 02/01/2019 CLINICAL DATA:  Metastatic rectal cancer EXAM: CT CHEST, ABDOMEN, AND PELVIS WITH CONTRAST TECHNIQUE: Multidetector CT imaging of the chest, abdomen and pelvis was performed following the standard protocol during bolus administration of intravenous contrast. CONTRAST:  155m ISOVUE-300 IOPAMIDOL (ISOVUE-300) INJECTION 61%, additional oral enteric contrast COMPARISON:  MR abdomen, 01/25/2019, CT abdomen pelvis, 11/09/2018 FINDINGS: CT CHEST FINDINGS Cardiovascular: No significant vascular findings. Normal heart size. No pericardial effusion. Mediastinum/Nodes: No enlarged mediastinal, hilar, or axillary lymph nodes. Thyroid gland, trachea, and esophagus demonstrate no significant findings. Lungs/Pleura: Biapical pleuroparenchymal scarring and multiple small nodules of the bilateral lung apices, the largest a 6 mm pulmonary nodule of the right apex (series 4, image 36). There is a 7 mm subpleural nodule of the anterior right middle lobe, significantly enlarged compared to prior examination, previously 3 mm (series 4, image 114). No pleural effusion or pneumothorax. Musculoskeletal: No chest wall mass or suspicious bone lesions identified. CT ABDOMEN PELVIS FINDINGS Hepatobiliary: Multiple bulky hypodense, rim enhancing masses of the liver, which are in keeping with findings of recent prior MRI and greatly enlarged compared to prior CT dated 11/09/2018. The largest index mass of the central right lobe of the liver measures 9.6 x 9.1 cm, previously 4.2 x 3.9 cm (series 2, image 50). No gallstones, gallbladder wall thickening, or biliary dilatation. Pancreas: Unremarkable. No pancreatic ductal dilatation or surrounding inflammatory  changes. Spleen: Normal in size without significant abnormality. Adrenals/Urinary Tract: Adrenal glands are unremarkable. Kidneys are normal, without renal calculi, solid lesion, or hydronephrosis. Bladder is unremarkable. Stomach/Bowel: Stomach is within normal limits. There is circumferential thickening of the rectum, which appears increased compared to prior examination (series 2, image 25). Large burden of stool throughout the colon. Vascular/Lymphatic: Aortic atherosclerosis. There is a soft tissue nodule adjacent to the superior rectum measuring 1.5 cm, enlarged compared to prior examination, previously 1.0 cm (series 2, image 97, series 5, image 101). Reproductive: No mass or other abnormality. Other: No abdominal wall hernia or abnormality. No abdominopelvic ascites. Musculoskeletal: No acute or significant osseous findings. IMPRESSION: 1. Multiple bulky hypodense, rim enhancing masses of the liver, which are in keeping with findings of recent prior MRI and greatly enlarged compared to prior CT dated 11/09/2018. The largest index mass of the central right lobe of the liver measures 9.6 x 9.1 cm, previously 4.2 x 3.9 cm (series 2, image 50). Findings are consistent with hepatic metastatic disease. 2. There is circumferential thickening of the rectum, which appears increased compared to prior examination (series 2, image 25), in keeping with primary rectal malignancy. There is a soft tissue nodule adjacent to the superior rectum measuring 1.5 cm, enlarged compared to prior examination, previously 1.0 cm (series 2, image 97, series 5, image 101), consistent with perirectal nodal metastatic disease. 3. There is a 7 mm subpleural nodule of the anterior right middle lobe, significantly enlarged compared to prior examination, previously 3 mm (series 4, image 114). This is presumably pulmonary metastatic disease. 4. Biapical pleuroparenchymal scarring and multiple small nodules of the bilateral lung apices, the  largest a 6 mm pulmonary nodule of the right apex (series 4, image 36). These findings are likely sequelae of prior infection or inflammation. Attention on follow-up. 5.  Aortic Atherosclerosis (ICD10-I70.0). Electronically  Signed   By: Eddie Candle M.D.   On: 02/01/2019 08:59   [place] CORE BIOPSY (LIVER)  Result Date: 02/07/2019 CLINICAL DATA:  Rectal carcinoma.  Multiple liver lesions. EXAM: ULTRASOUND GUIDED CORE BIOPSY OF LIVER LESION MEDICATIONS: Intravenous Fentanyl 78mg and Versed 120mwere administered as conscious sedation during continuous monitoring of the patient's level of consciousness and physiological / cardiorespiratory status by the radiology RN, with a total moderate sedation time of 10 minutes. PROCEDURE: The procedure, risks, benefits, and alternatives were explained to the patient. Questions regarding the procedure were encouraged and answered. The patient understands and consents to the procedure. A representative right lobe lesion was localized and appropriate skin entry site determined and marked. The operative field was prepped with chlorhexidine in a sterile fashion, and a sterile drape was applied covering the operative field. A sterile gown and sterile gloves were used for the procedure. Local anesthesia was provided with 1% Lidocaine. Under real-time ultrasound guidance, a 17 gauge trocar needle was advanced to the margin of the lesion. Once needle tip position was confirmed, coaxial 18-gauge core biopsy samples were obtained, submitted in formalin to surgical pathology. The guide needle was removed. Postprocedure scans show no hemorrhage or other apparent complication. The patient tolerated the procedure well. COMPLICATIONS: None. FINDINGS: Multiple large liver lesions were identified corresponding to recent CT findings. Representative core biopsy samples obtained IMPRESSION: 1. Technically successful ultrasound-guided core biopsy, liver lesion. Electronically Signed   By: D Lucrezia EuropeM.D.   On: 02/07/2019 13:52   IR IMAGING GUIDED PORT INSERTION  Result Date: 02/10/2019 CLINICAL DATA:  METASTATIC COLON CANCER, ACCESS FOR CHEMOTHERAPY EXAM: RIGHT INTERNAL JUGULAR SINGLE LUMEN POWER PORT CATHETER INSERTION Date:  02/10/2019 02/10/2019 10:20 am Radiologist:  M. TrDaryll BrodMD Guidance:  Ultrasound and fluoroscopic MEDICATIONS: Ancef 2 g; The antibiotic was administered within an appropriate time interval prior to skin puncture. ANESTHESIA/SEDATION: Versed 2.0 mg IV; Fentanyl 100 mcg IV; Moderate Sedation Time:  24 minutes The patient was continuously monitored during the procedure by the interventional radiology nurse under my direct supervision. FLUOROSCOPY TIME:  0 minutes, 36 seconds (2 mGy) COMPLICATIONS: None immediate. CONTRAST:  None. PROCEDURE: Informed consent was obtained from the patient following explanation of the procedure, risks, benefits and alternatives. The patient understands, agrees and consents for the procedure. All questions were addressed. A time out was performed. Maximal barrier sterile technique utilized including caps, mask, sterile gowns, sterile gloves, large sterile drape, hand hygiene, and 2% chlorhexidine scrub. Under sterile conditions and local anesthesia, right internal jugular micropuncture venous access was performed. Access was performed with ultrasound. Images were obtained for documentation of the patent right internal jugular vein. A guide wire was inserted followed by a transitional dilator. This allowed insertion of a guide wire and catheter into the IVC. Measurements were obtained from the SVC / RA junction back to the right IJ venotomy site. In the right infraclavicular chest, a subcutaneous pocket was created over the second anterior rib. This was done under sterile conditions and local anesthesia. 1% lidocaine with epinephrine was utilized for this. A 2.5 cm incision was made in the skin. Blunt dissection was performed to create a subcutaneous  pocket over the right pectoralis major muscle. The pocket was flushed with saline vigorously. There was adequate hemostasis. The port catheter was assembled and checked for leakage. The port catheter was secured in the pocket with two retention sutures. The tubing was tunneled subcutaneously to the right venotomy site and inserted into the SVC/RA  junction through a valved peel-away sheath. Position was confirmed with fluoroscopy. Images were obtained for documentation. The patient tolerated the procedure well. No immediate complications. Incisions were closed in a two layer fashion with 4 - 0 Vicryl suture. Dermabond was applied to the skin. The port catheter was accessed, blood was aspirated followed by saline and heparin flushes. Needle was removed. A dry sterile dressing was applied. IMPRESSION: Ultrasound and fluoroscopically guided right internal jugular single lumen power port catheter insertion. Tip in the SVC/RA junction. Catheter ready for use. Electronically Signed   By: [name].  Shick M.D.   On: 02/10/2019 10:56

## 2019-02-14 NOTE — Progress Notes (Signed)
Patient here for first treatment. She had port placed last week. She states it's still tender but not bad. Due to [place] expediting her treatment, she is also receiving her chemo education today.   We haven't yet had patient meet with the dietician. I will make an appointment for next week. She prefers a phone call over coming into the office. Also offered her supplement samples, but at this time she doesn't need them. Instructed her to ask for samples, if she wanted to start taking them. She agreed.

## 2019-02-14 NOTE — Progress Notes (Unsigned)
Patient in chemotherapy education class with  Daughter,  Discussed side effects of   5FU, leucovorin, Oxaliplatin                     which include but are not limited to myelosuppression, decreased appetite, fatigue, fever, allergic or infusional reaction, mucositis, cardiac toxicity, cough, SOB, altered taste, nausea and vomiting, diarrhea, constipation, elevated LFTs myalgia and arthralgias, hair loss or thinning, rash, skin dryness, nail changes, peripheral neuropathy, discolored urine, delayed wound healing, mental changes (Chemo brain), increased risk of infections, weight loss.  Reviewed infusion room and office policy and procedure and phone numbers 24 hours x 7 days a week.  Reviewed ambulatory pump specifics and how to manage safe handling at home.  Reviewed when to call the office with any concerns or problems.  Scientist, clinical (histocompatibility and immunogenetics) given.  Discussed portacath insertion and EMLA cream administration.  Antiemetic protocol and chemotherapy schedule reviewed. Patient verbalized understanding of chemotherapy indications and possible side effects.  Teachback done

## 2019-02-14 NOTE — Patient Instructions (Signed)
Implanted Port Insertion, Care After °This sheet gives you information about how to care for yourself after your procedure. Your health care provider may also give you more specific instructions. If you have problems or questions, contact your health care provider. °What can I expect after the procedure? °After the procedure, it is common to have: °· Discomfort at the port insertion site. °· Bruising on the skin over the port. This should improve over 3-4 days. °Follow these instructions at home: °Port care °· After your port is placed, you will get a manufacturer's information card. The card has information about your port. Keep this card with you at all times. °· Take care of the port as told by your health care provider. Ask your health care provider if you or a family member can get training for taking care of the port at home. A home health care nurse may also take care of the port. °· Make sure to remember what type of port you have. °Incision care ° °  ° °· Follow instructions from your health care provider about how to take care of your port insertion site. Make sure you: °? Wash your hands with soap and water before and after you change your bandage (dressing). If soap and water are not available, use hand sanitizer. °? Change your dressing as told by your health care provider. °? Leave stitches (sutures), skin glue, or adhesive strips in place. These skin closures may need to stay in place for 2 weeks or longer. If adhesive strip edges start to loosen and curl up, you may trim the loose edges. Do not remove adhesive strips completely unless your health care provider tells you to do that. °· Check your port insertion site every day for signs of infection. Check for: °? Redness, swelling, or pain. °? Fluid or blood. °? Warmth. °? Pus or a bad smell. °Activity °· Return to your normal activities as told by your health care provider. Ask your health care provider what activities are safe for you. °· Do not  lift anything that is heavier than 10 lb (4.5 kg), or the limit that you are told, until your health care provider says that it is safe. °General instructions °· Take over-the-counter and prescription medicines only as told by your health care provider. °· Do not take baths, swim, or use a hot tub until your health care provider approves. Ask your health care provider if you may take showers. You may only be allowed to take sponge baths. °· Do not drive for 24 hours if you were given a sedative during your procedure. °· Wear a medical alert bracelet in case of an emergency. This will tell any health care providers that you have a port. °· Keep all follow-up visits as told by your health care provider. This is important. °Contact a health care provider if: °· You cannot flush your port with saline as directed, or you cannot draw blood from the port. °· You have a fever or chills. °· You have redness, swelling, or pain around your port insertion site. °· You have fluid or blood coming from your port insertion site. °· Your port insertion site feels warm to the touch. °· You have pus or a bad smell coming from the port insertion site. °Get help right away if: °· You have chest pain or shortness of breath. °· You have bleeding from your port that you cannot control. °Summary °· Take care of the port as told by your health   care provider. Keep the manufacturer's information card with you at all times. °· Change your dressing as told by your health care provider. °· Contact a health care provider if you have a fever or chills or if you have redness, swelling, or pain around your port insertion site. °· Keep all follow-up visits as told by your health care provider. °This information is not intended to replace advice given to you by your health care provider. Make sure you discuss any questions you have with your health care provider. °Document Revised: 07/27/2017 Document Reviewed: 07/27/2017 °Elsevier Patient Education ©  2020 [employer] ° °

## 2019-02-16 ENCOUNTER — Other Ambulatory Visit: Payer: Self-pay | Admitting: Hematology

## 2019-02-16 ENCOUNTER — Other Ambulatory Visit: Payer: Self-pay

## 2019-02-16 ENCOUNTER — Inpatient Hospital Stay: Payer: 59

## 2019-02-16 VITALS — BP 92/68 | HR 68 | Temp 97.7°F | Resp 15

## 2019-02-16 DIAGNOSIS — Z5111 Encounter for antineoplastic chemotherapy: Secondary | ICD-10-CM | POA: Diagnosis not present

## 2019-02-16 DIAGNOSIS — C787 Secondary malignant neoplasm of liver and intrahepatic bile duct: Secondary | ICD-10-CM | POA: Diagnosis not present

## 2019-02-16 DIAGNOSIS — Z452 Encounter for adjustment and management of vascular access device: Secondary | ICD-10-CM | POA: Diagnosis not present

## 2019-02-16 DIAGNOSIS — C20 Malignant neoplasm of rectum: Secondary | ICD-10-CM | POA: Diagnosis not present

## 2019-02-16 MED ORDER — COLD PACK MISC ONCOLOGY
1.0000 | Freq: Once | Status: DC | PRN
  Filled 2019-02-16: qty 1

## 2019-02-16 MED ORDER — HEPARIN SOD (PORK) LOCK FLUSH 100 UNIT/ML IV SOLN
500.0000 [IU] | Freq: Once | INTRAVENOUS | Status: AC | PRN
  Administered 2019-02-16: 15:00:00 500 [IU]
  Filled 2019-02-16: qty 5

## 2019-02-16 MED ORDER — SODIUM CHLORIDE 0.9 % IV SOLN
Freq: Once | INTRAVENOUS | Status: AC
  Filled 2019-02-16: qty 250

## 2019-02-16 MED ORDER — SODIUM CHLORIDE 0.9% FLUSH
10.0000 mL | INTRAVENOUS | Status: DC | PRN
  Administered 2019-02-16: 10 mL
  Filled 2019-02-16: qty 10

## 2019-02-16 NOTE — Patient Instructions (Signed)

## 2019-02-16 NOTE — Progress Notes (Signed)
OK to speed up the rate to 750 ml/hr per Dr. Maylon Peppers.

## 2019-02-20 ENCOUNTER — Ambulatory Visit
Admission: RE | Admit: 2019-02-20 | Discharge: 2019-02-20 | Disposition: A | Discharge status: Home / Self Care | Payer: 59 | Source: Ambulatory Visit | Attending: Surgery | Admitting: Surgery

## 2019-02-20 ENCOUNTER — Other Ambulatory Visit: Payer: Self-pay | Admitting: Surgery

## 2019-02-20 ENCOUNTER — Other Ambulatory Visit: Payer: Self-pay

## 2019-02-20 DIAGNOSIS — C20 Malignant neoplasm of rectum: Secondary | ICD-10-CM

## 2019-02-21 ENCOUNTER — Inpatient Hospital Stay: Payer: 59 | Admitting: Nutrition

## 2019-02-21 NOTE — Progress Notes (Signed)
54 year old female diagnosed with Rectal cancer with mets. She is a patient of Dr. Maylon Peppers. She is receiving FOLFOX.  PMH includes DDD.  Medications include decadron, ativan, MVI, Zofran, Miralax, Compazine, and Senna.  Labs include Glucose 149 and Alb 4.0 on Feb 2.  Height: 5'8". Weight: 128 pounds. UBW: 138 pounds per patient. BMI: 19.48  Patient reports slight nausea which improved with eating something. She reports metallic taste. She has indigestion. Endorses a 10 pound weight loss.  Nutrition Diagnosis: Unintended weight loss related to metastatic cancer and associated treatments as evidenced by 7 % weight loss.  Intervention: Educated to consume small, frequent meals and snacks with increased calories and protein. Encouraged anti emetics as needed. Educated on strategies for improving taste alterations. Reviewed tips to help with indigestion. Encouraged protein shakes or protein bars. Will mail fact sheets. Questions answered and teach back method used.  Monitoring, Evaluation, Goals: Patient will increase calories and protein to minimize weight loss.  Next Visit: Patient will call me with questions or concerns.

## 2019-02-28 ENCOUNTER — Inpatient Hospital Stay: Payer: 59

## 2019-02-28 ENCOUNTER — Telehealth: Payer: Self-pay | Admitting: Hematology

## 2019-02-28 ENCOUNTER — Encounter: Payer: Self-pay | Admitting: *Deleted

## 2019-02-28 ENCOUNTER — Other Ambulatory Visit: Payer: Self-pay

## 2019-02-28 ENCOUNTER — Encounter: Payer: Self-pay | Admitting: Hematology

## 2019-02-28 ENCOUNTER — Inpatient Hospital Stay (HOSPITAL_BASED_OUTPATIENT_CLINIC_OR_DEPARTMENT_OTHER): Payer: 59 | Admitting: Hematology

## 2019-02-28 VITALS — BP 93/70 | HR 80 | Temp 97.3°F | Resp 16 | Ht 68.0 in | Wt 124.0 lb

## 2019-02-28 DIAGNOSIS — Z452 Encounter for adjustment and management of vascular access device: Secondary | ICD-10-CM | POA: Diagnosis not present

## 2019-02-28 DIAGNOSIS — G893 Neoplasm related pain (acute) (chronic): Secondary | ICD-10-CM

## 2019-02-28 DIAGNOSIS — C20 Malignant neoplasm of rectum: Secondary | ICD-10-CM

## 2019-02-28 DIAGNOSIS — Z5111 Encounter for antineoplastic chemotherapy: Secondary | ICD-10-CM | POA: Diagnosis not present

## 2019-02-28 DIAGNOSIS — R7989 Other specified abnormal findings of blood chemistry: Secondary | ICD-10-CM

## 2019-02-28 DIAGNOSIS — C787 Secondary malignant neoplasm of liver and intrahepatic bile duct: Secondary | ICD-10-CM | POA: Diagnosis not present

## 2019-02-28 LAB — CBC WITH DIFFERENTIAL (CANCER CENTER ONLY)
Abs Immature Granulocytes: 0.02 10*3/uL (ref 0.00–0.07)
Basophils Absolute: 0 10*3/uL (ref 0.0–0.1)
Basophils Relative: 1 %
Eosinophils Absolute: 0.1 10*3/uL (ref 0.0–0.5)
Eosinophils Relative: 2 %
HCT: 38.5 % (ref 36.0–46.0)
Hemoglobin: 13.1 g/dL (ref 12.0–15.0)
Immature Granulocytes: 0 %
Lymphocytes Relative: 21 %
Lymphs Abs: 1.2 10*3/uL (ref 0.7–4.0)
MCH: 30.3 pg (ref 26.0–34.0)
MCHC: 34 g/dL (ref 30.0–36.0)
MCV: 88.9 fL (ref 80.0–100.0)
Monocytes Absolute: 0.6 10*3/uL (ref 0.1–1.0)
Monocytes Relative: 11 %
Neutro Abs: 3.7 10*3/uL (ref 1.7–7.7)
Neutrophils Relative %: 65 %
Platelet Count: 212 10*3/uL (ref 150–400)
RBC: 4.33 MIL/uL (ref 3.87–5.11)
RDW: 12.4 % (ref 11.5–15.5)
WBC Count: 5.6 10*3/uL (ref 4.0–10.5)
nRBC: 0 % (ref 0.0–0.2)

## 2019-02-28 LAB — CMP (CANCER CENTER ONLY)
ALT: 36 U/L (ref 0–44)
AST: 37 U/L (ref 15–41)
Albumin: 4.2 g/dL (ref 3.5–5.0)
Alkaline Phosphatase: 226 U/L — ABNORMAL HIGH (ref 38–126)
Anion gap: 8 (ref 5–15)
BUN: 9 mg/dL (ref 6–20)
CO2: 29 mmol/L (ref 22–32)
Calcium: 9.6 mg/dL (ref 8.9–10.3)
Chloride: 100 mmol/L (ref 98–111)
Creatinine: 0.62 mg/dL (ref 0.44–1.00)
GFR, Est AFR Am: >60 mL/min (ref >60)
GFR, Estimated: >60 mL/min (ref >60)
Glucose, Bld: 122 mg/dL — ABNORMAL HIGH (ref 70–99)
Potassium: 3.6 mmol/L (ref 3.5–5.1)
Sodium: 137 mmol/L (ref 135–145)
Total Bilirubin: 0.7 mg/dL (ref 0.3–1.2)
Total Protein: 6.4 g/dL — ABNORMAL LOW (ref 6.5–8.1)

## 2019-02-28 MED ORDER — DEXAMETHASONE SODIUM PHOSPHATE 10 MG/ML IJ SOLN
10.0000 mg | Freq: Once | INTRAMUSCULAR | Status: AC
  Administered 2019-02-28: 10 mg via INTRAVENOUS

## 2019-02-28 MED ORDER — SODIUM CHLORIDE 0.9% FLUSH
10.0000 mL | INTRAVENOUS | Status: DC | PRN
  Filled 2019-02-28: qty 10

## 2019-02-28 MED ORDER — PALONOSETRON HCL INJECTION 0.25 MG/5ML
0.2500 mg | Freq: Once | INTRAVENOUS | Status: AC
  Administered 2019-02-28: 11:00:00 0.25 mg via INTRAVENOUS

## 2019-02-28 MED ORDER — SODIUM CHLORIDE 0.9 % IV SOLN
2400.0000 mg/m2 | INTRAVENOUS | Status: DC
  Administered 2019-02-28: 4000 mg via INTRAVENOUS
  Filled 2019-02-28: qty 80

## 2019-02-28 MED ORDER — HEPARIN SOD (PORK) LOCK FLUSH 100 UNIT/ML IV SOLN
500.0000 [IU] | Freq: Once | INTRAVENOUS | Status: DC | PRN
  Filled 2019-02-28: qty 5

## 2019-02-28 MED ORDER — DEXTROSE 5 % IV SOLN
Freq: Once | INTRAVENOUS | Status: AC
  Filled 2019-02-28: qty 250

## 2019-02-28 MED ORDER — DEXAMETHASONE SODIUM PHOSPHATE 10 MG/ML IJ SOLN
INTRAMUSCULAR | Status: AC
  Filled 2019-02-28: qty 1

## 2019-02-28 MED ORDER — PALONOSETRON HCL INJECTION 0.25 MG/5ML
INTRAVENOUS | Status: AC
  Filled 2019-02-28: qty 5

## 2019-02-28 MED ORDER — LEUCOVORIN CALCIUM INJECTION 350 MG
400.0000 mg/m2 | Freq: Once | INTRAVENOUS | Status: AC
  Administered 2019-02-28: 668 mg via INTRAVENOUS
  Filled 2019-02-28: qty 33.4

## 2019-02-28 MED ORDER — FLUOROURACIL CHEMO INJECTION 2.5 GM/50ML
400.0000 mg/m2 | Freq: Once | INTRAVENOUS | Status: AC
  Administered 2019-02-28: 650 mg via INTRAVENOUS
  Filled 2019-02-28: qty 13

## 2019-02-28 MED ORDER — OXALIPLATIN CHEMO INJECTION 100 MG/20ML
85.0000 mg/m2 | Freq: Once | INTRAVENOUS | Status: AC
  Administered 2019-02-28: 11:00:00 140 mg via INTRAVENOUS
  Filled 2019-02-28: qty 10

## 2019-02-28 NOTE — Patient Instructions (Signed)
[facility] Discharge Instructions for Patients Receiving Chemotherapy  Today you received the following chemotherapy agents 5FU, Oxaliplatin  To help prevent nausea and vomiting after your treatment, we encourage you to take your nausea medication    If you develop nausea and vomiting that is not controlled by your nausea medication, call the clinic.   BELOW ARE SYMPTOMS THAT SHOULD BE REPORTED IMMEDIATELY:  *FEVER GREATER THAN 100.5 F  *CHILLS WITH OR WITHOUT FEVER  NAUSEA AND VOMITING THAT IS NOT CONTROLLED WITH YOUR NAUSEA MEDICATION  *UNUSUAL SHORTNESS OF BREATH  *UNUSUAL BRUISING OR BLEEDING  TENDERNESS IN MOUTH AND THROAT WITH OR WITHOUT PRESENCE OF ULCERS  *URINARY PROBLEMS  *BOWEL PROBLEMS  UNUSUAL RASH Items with * indicate a potential emergency and should be followed up as soon as possible.  Feel free to call the clinic should you have any questions or concerns. The clinic phone number is [phone].  Please show the CHEMO ALERT CARD at check-in to the Emergency Department and triage nurse.   

## 2019-02-28 NOTE — Progress Notes (Signed)
[facility] OFFICE PROGRESS NOTE  Patient Care Team: [facility] Harris, [place], DO as PCP - General (Family Medicine) Sharon Men, MD as Medical Oncologist (Hematology) Sharon Poche, RN as Oncology Nurse Navigator  HEME/ONC OVERVIEW: 1. Metastatic rectal adenocarcinoma to the liver and likely lungs; MMR proficient, KRAS mutated, HER2 amplified  -10/2018: multiple liver masses on CT abdomen, largest 4.6x4.2cm, suggestive of hemangiomas; no definite primary malignancy;  -01/2019:   A large rectal mass obstructing the proximal rectum, ~10cm from the anal verge, circumferential; bx'ed, adenocarcinoma  Numerous masses involving all segments of liver, largest ~8cm, as well as enlarging subcentimeter pulmonary nodules  Liver bx showed adenocarcinoma, c/w rectal primary; MSI stable, KRAS mutated, HER2 amplified  -02/2019 - present: 1st line FOLFOX   TREATMENT SUMMARY:  02/14/2019 - present: 1st line FOLFOX   ASSESSMENT & PLAN:   Metastatic rectal adenocarcinoma to the liver and likely lungs; MMR proficient, KRAS mutated, HER2 amplified  -Sharon/p 1 cycle of FOLFOX in the 1st line setting; not a candidate for Vectibix due to KRAS mutation or Avastin due to near obstruction in the rectum  -Labs adequate, proceed with Cycle 2 of FOLFOX -We will plan to repeat scans after 4 cycles of chemotherapy to assess disease response  -I will also reach out to Dr. Reynaldo [facility] at [facility] regarding any clinical trial for HER2 amplified rectal cancer  -PRN anti-emetics: Zofran, Compazine, dexamethasone   Cancer-related pain -Secondary to rectal adenocarcinoma and extensive liver metastases -Pain relatively well controlled with Sharon-Cont 37m BID and PRN [facility] -Continue the current regimen for now   Elevated LFT'Sharon  -Likely secondary to extensive liver masses -Alk phos 226, stable; AST, ALT and Tbili normal  -We will monitor it for now   No orders of the defined types were placed in this encounter.  All  questions were answered. The patient knows to call the clinic with any problems, questions or concerns. No barriers to learning was detected.  Return in 2 weeks for Cycle 3 of chemotherapy.   Sharon Men MD 2/16/20219:58 AM  CHIEF COMPLAINT: "I am doing fine"  INTERVAL HISTORY: Sharon. [place] clinic for follow-up of metastatic rectal adenocarcinoma on palliative FOLFOX.  Patient reports that she tolerated the first cycle of chemotherapy well, and denied any nausea, vomiting, diarrhea, or neuropathy.  She has some mild cold sensitivity in the fingertips, which resolved after a few days.  Her pain in the right upper quadrant has improved, and she has not had to take Sharon-Contin for a few days.  She denies any other complaint today.  REVIEW OF SYSTEMS:   Constitutional: ( - ) fevers, ( - )  chills , ( - ) night sweats Eyes: ( - ) blurriness of vision, ( - ) double vision, ( - ) watery eyes Ears, nose, mouth, throat, and face: ( - ) mucositis, ( - ) sore throat Respiratory: ( - ) cough, ( - ) dyspnea, ( - ) wheezes Cardiovascular: ( - ) palpitation, ( - ) chest discomfort, ( - ) lower extremity swelling Gastrointestinal:  ( - ) nausea, ( - ) heartburn, ( - ) change in bowel habits Skin: ( - ) abnormal skin rashes Lymphatics: ( - ) new lymphadenopathy, ( - ) easy bruising Neurological: ( - ) numbness, ( - ) tingling, ( - ) new weaknesses Behavioral/Psych: ( - ) mood change, ( - ) new changes  All other systems were reviewed with the patient and are negative.  SUMMARY OF ONCOLOGIC HISTORY:  Oncology History  Rectal adenocarcinoma ([place])  11/09/2018 Imaging   CT abdomen/pelvis w/ contrast: IMPRESSION: 1. Diffuse stool throughout the colon. No bowel obstruction. No abscess in the abdomen or pelvis. Appendix absent.   2. Multiple liver masses, likely multiple hemangiomas. Note that these lesions on current examination do not have classic hemangioma appearance. Given the size and multiplicity  of these lesions, further assessment nonemergently would be advisable. In this regard, pre and serial post-contrast MR of the liver would be the optimum imaging study of choice to further evaluate. If there is a contraindication to MR, pre and serial post-contrast CT of the liver would be an alternative for further assessment.   3. 3 mm nodular opacity in the right middle lobe. No follow-up needed if patient is low-risk. Non-contrast chest CT can be considered in 12 months if patient is high-risk. This recommendation follows the consensus statement: Guidelines for Management of Incidental Pulmonary Nodules Detected on CT Images: From the Fleischner Society 2017; Radiology 2017; 284:228-243.   4. No evident renal or ureteral calculus. No hydronephrosis. Urinary bladder wall thickness normal.   5.  Aortic Atherosclerosis (ICD10-I70.0).   01/20/2019 Procedure   Colonoscopy: - Hemorrhoids were found on perianal exam. - A frond-like/villous, fungating and ulcerated completely obstructing large mass was found in the proximal rectum, located approximately 10 cm from the anal verge. The mass was circumferential and non-traversable. Oozing was present. This was extensively biopsied with a cold forceps for histology. Path was sent as rush. Estimated blood loss was minimal. - Non-bleeding internal hemorrhoids were found during retroflexion. The hemorrhoids were large.   01/20/2019 Imaging   EGD: - The examined esophagus was normal. - The entire examined stomach was normal. Biopsies were taken with a cold forceps for Helicobacter pylori testing. Estimated blood loss was minimal. - The duodenal bulb, first portion of the duodenum and second portion of the duodenum were normal. Biopsies were taken with a cold forceps for histology. Estimated blood loss was minimal.   01/24/2019 Initial Diagnosis   Rectal adenocarcinoma ([place])   01/25/2019 Imaging   MRI abdomen: IMPRESSION: 1. Marked worsening  of what is now characterized as hepatic metastatic disease, in this patient who now is known to have rectal cancer.   02/01/2019 Imaging   CT chest: IMPRESSION: 1. Multiple bulky hypodense, rim enhancing masses of the liver, which are in keeping with findings of recent prior MRI and greatly enlarged compared to prior CT dated 11/09/2018. The largest index mass of the central right lobe of the liver measures 9.6 x 9.1 cm, previously 4.2 x 3.9 cm (series 2, image 50). Findings are consistent with hepatic metastatic disease. 2. There is circumferential thickening of the rectum, which appears increased compared to prior examination (series 2, image 25), in keeping with primary rectal malignancy. There is a soft tissue nodule adjacent to the superior rectum measuring 1.5 cm, enlarged compared to prior examination, previously 1.0 cm (series 2, image 97, series 5, image 101), consistent with perirectal nodal metastatic disease. 3. There is a 7 mm subpleural nodule of the anterior right middle lobe, significantly enlarged compared to prior examination, previously 3 mm (series 4, image 114). This is presumably pulmonary metastatic disease. 4. Biapical pleuroparenchymal scarring and multiple small nodules of the bilateral lung apices, the largest a 6 mm pulmonary nodule of the right apex (series 4, image 36). These findings are likely sequelae of prior infection or inflammation. Attention on follow-up. 5.  Aortic Atherosclerosis (ICD10-I70.0).   02/01/2019 Imaging  IMPRESSION: 1. Multiple bulky hypodense, rim enhancing masses of the liver, which are in keeping with findings of recent prior MRI and greatly enlarged compared to prior CT dated 11/09/2018. The largest index mass of the central right lobe of the liver measures 9.6 x 9.1 cm, previously 4.2 x 3.9 cm (series 2, image 50). Findings are consistent with hepatic metastatic disease. 2. There is circumferential thickening of the rectum,  which appears increased compared to prior examination (series 2, image 25), in keeping with primary rectal malignancy. There is a soft tissue nodule adjacent to the superior rectum measuring 1.5 cm, enlarged compared to prior examination, previously 1.0 cm (series 2, image 97, series 5, image 101), consistent with perirectal nodal metastatic disease. 3. There is a 7 mm subpleural nodule of the anterior right middle lobe, significantly enlarged compared to prior examination, previously 3 mm (series 4, image 114). This is presumably pulmonary metastatic disease. 4. Biapical pleuroparenchymal scarring and multiple small nodules of the bilateral lung apices, the largest a 6 mm pulmonary nodule of the right apex (series 4, image 36). These findings are likely sequelae of prior infection or inflammation. Attention on follow-up. 5.  Aortic Atherosclerosis (ICD10-I70.0).   02/07/2019 Pathology Results   FINAL MICROSCOPIC DIAGNOSIS:   A. LIVER, BIOPSY:  - Metastatic adenocarcinoma to the liver, consistent with patient'Sharon  clinical history of primary rectal adenocarcinoma.  See comment   02/09/2019 Cancer Staging   Staging form: Colon and Rectum, AJCC 8th Edition - Clinical: Stage Unknown (cTX, cNX, cM1) - Signed by Sharon Men, MD on 02/09/2019   02/14/2019 -  Chemotherapy   The patient had palonosetron (ALOXI) injection 0.25 mg, 0.25 mg, Intravenous,  Once, 1 of 6 cycles Administration: 0.25 mg (02/14/2019) leucovorin 668 mg in dextrose 5 % 250 mL infusion, 400 mg/m2 = 668 mg, Intravenous,  Once, 1 of 6 cycles Administration: 668 mg (02/14/2019) oxaliplatin (ELOXATIN) 140 mg in dextrose 5 % 500 mL chemo infusion, 85 mg/m2 = 140 mg, Intravenous,  Once, 1 of 6 cycles Administration: 140 mg (02/14/2019) fluorouracil (ADRUCIL) chemo injection 650 mg, 400 mg/m2 = 650 mg, Intravenous,  Once, 1 of 6 cycles Administration: 650 mg (02/14/2019) fluorouracil (ADRUCIL) 4,000 mg in sodium chloride 0.9 % 70 mL chemo  infusion, 2,400 mg/m2 = 4,000 mg, Intravenous, 1 Day/Dose, 1 of 6 cycles Administration: 4,000 mg (02/14/2019)  for chemotherapy treatment.      I have reviewed the past medical history, past surgical history, social history and family history with the patient and they are unchanged from previous note.  ALLERGIES:  has No Known Allergies.  MEDICATIONS:  Current Outpatient Medications  Medication Sig Dispense Refill  . AMBULATORY NON FORMULARY MEDICATION 2 tablets 2 (two) times daily. [place]    . dexamethasone (DECADRON) 4 MG tablet Take 2 tablets (8 mg total) by mouth daily. Start the day after chemotherapy for 2 days. Take with food. 30 tablet 1  . HYDROcodone-acetaminophen ([facility]) 5-325 MG tablet Take 1 tablet by mouth every 6 (six) hours as needed for moderate pain. 50 tablet 0  . hydrocortisone (ANUSOL-HC) 2.5 % rectal cream Place 1 application rectally 2 (two) times daily. 30 g 0  . lidocaine-prilocaine (EMLA) cream Apply to affected area once 30 g 3  . LORazepam (ATIVAN) 0.5 MG tablet Take 1 tablet (0.5 mg total) by mouth every 6 (six) hours as needed (Nausea or vomiting). 30 tablet 0  . Lysine 500 MG TABS Take 1 tablet by mouth daily.    Marland Kitchen  morphine (Sharon CONTIN) 15 MG 12 hr tablet Take 1 tablet (15 mg total) by mouth every 12 (twelve) hours. 60 tablet 0  . Multiple Vitamin (MULTIVITAMIN WITH MINERALS) TABS tablet Take 1 tablet by mouth daily.    . ondansetron (ZOFRAN) 8 MG tablet Take 1 tablet (8 mg total) by mouth 2 (two) times daily as needed for refractory nausea / vomiting. Start on day 3 after chemotherapy. 30 tablet 1  . polyethylene glycol powder (GLYCOLAX/MIRALAX) 17 GM/SCOOP powder Take 17 g by mouth 2 (two) times daily as needed. 3350 g 1  . prochlorperazine (COMPAZINE) 10 MG tablet Take 1 tablet (10 mg total) by mouth every 6 (six) hours as needed (Nausea or vomiting). 30 tablet 1   No current facility-administered medications for this visit.    PHYSICAL EXAMINATION: ECOG  PERFORMANCE STATUS: 0 - Asymptomatic  Today'Sharon Vitals   02/28/19 0951  BP: 93/70  Pulse: 80  Resp: 16  Temp: (!) 97.3 F (36.3 C)  TempSrc: Temporal  SpO2: 98%  Weight: 124 lb (56.2 kg)  Height: 5' 8"  (1.727 m)  PainSc: 0-No pain   Body mass index is 18.85 kg/m.  Filed Weights   02/28/19 0951  Weight: 124 lb (56.2 kg)    GENERAL: alert, no distress and comfortable SKIN: skin color, texture, turgor are normal, no rashes or significant lesions EYES: conjunctiva are pink and non-injected, sclera clear OROPHARYNX: no exudate, no erythema; lips, buccal mucosa, and tongue normal  NECK: supple, non-tender LUNGS: clear to auscultation with normal breathing effort HEART: regular rate & rhythm and no murmurs and no lower extremity edema ABDOMEN: soft, non-tender, non-distended, normal bowel sounds Musculoskeletal: no cyanosis of digits and no clubbing  PSYCH: alert & oriented x 3, fluent speech  LABORATORY DATA:  I have reviewed the data as listed    Component Value Date/Time   NA 138 02/14/2019 0915   NA 141 11/02/2018 1654   K 3.7 02/14/2019 0915   CL 101 02/14/2019 0915   CO2 31 02/14/2019 0915   GLUCOSE 149 (H) 02/14/2019 0915   BUN 10 02/14/2019 0915   BUN 15 11/02/2018 1654   CREATININE 0.58 02/14/2019 0915   CALCIUM 9.2 02/14/2019 0915   PROT 6.4 (L) 02/14/2019 0915   PROT 6.7 11/02/2018 1654   ALBUMIN 4.0 02/14/2019 0915   ALBUMIN 4.7 11/02/2018 1654   AST 56 (H) 02/14/2019 0915   ALT 42 02/14/2019 0915   ALKPHOS 225 (H) 02/14/2019 0915   BILITOT 0.8 02/14/2019 0915   GFRNONAA >60 02/14/2019 0915   GFRAA >60 02/14/2019 0915    No results found for: SPEP, UPEP  Lab Results  Component Value Date   WBC 5.6 02/28/2019   NEUTROABS 3.7 02/28/2019   HGB 13.1 02/28/2019   HCT 38.5 02/28/2019   MCV 88.9 02/28/2019   PLT 212 02/28/2019      Chemistry      Component Value Date/Time   NA 138 02/14/2019 0915   NA 141 11/02/2018 1654   K 3.7 02/14/2019 0915    CL 101 02/14/2019 0915   CO2 31 02/14/2019 0915   BUN 10 02/14/2019 0915   BUN 15 11/02/2018 1654   CREATININE 0.58 02/14/2019 0915      Component Value Date/Time   CALCIUM 9.2 02/14/2019 0915   ALKPHOS 225 (H) 02/14/2019 0915   AST 56 (H) 02/14/2019 0915   ALT 42 02/14/2019 0915   BILITOT 0.8 02/14/2019 0915       RADIOGRAPHIC STUDIES: I  have personally reviewed the radiological images as listed below and agreed with the findings in the report. CT CHEST W CONTRAST  Result Date: 02/01/2019 CLINICAL DATA:  Metastatic rectal cancer EXAM: CT CHEST, ABDOMEN, AND PELVIS WITH CONTRAST TECHNIQUE: Multidetector CT imaging of the chest, abdomen and pelvis was performed following the standard protocol during bolus administration of intravenous contrast. CONTRAST:  168m ISOVUE-300 IOPAMIDOL (ISOVUE-300) INJECTION 61%, additional oral enteric contrast COMPARISON:  MR abdomen, 01/25/2019, CT abdomen pelvis, 11/09/2018 FINDINGS: CT CHEST FINDINGS Cardiovascular: No significant vascular findings. Normal heart size. No pericardial effusion. Mediastinum/Nodes: No enlarged mediastinal, hilar, or axillary lymph nodes. Thyroid gland, trachea, and esophagus demonstrate no significant findings. Lungs/Pleura: Biapical pleuroparenchymal scarring and multiple small nodules of the bilateral lung apices, the largest a 6 mm pulmonary nodule of the right apex (series 4, image 36). There is a 7 mm subpleural nodule of the anterior right middle lobe, significantly enlarged compared to prior examination, previously 3 mm (series 4, image 114). No pleural effusion or pneumothorax. Musculoskeletal: No chest wall mass or suspicious bone lesions identified. CT ABDOMEN PELVIS FINDINGS Hepatobiliary: Multiple bulky hypodense, rim enhancing masses of the liver, which are in keeping with findings of recent prior MRI and greatly enlarged compared to prior CT dated 11/09/2018. The largest index mass of the central right lobe of the  liver measures 9.6 x 9.1 cm, previously 4.2 x 3.9 cm (series 2, image 50). No gallstones, gallbladder wall thickening, or biliary dilatation. Pancreas: Unremarkable. No pancreatic ductal dilatation or surrounding inflammatory changes. Spleen: Normal in size without significant abnormality. Adrenals/Urinary Tract: Adrenal glands are unremarkable. Kidneys are normal, without renal calculi, solid lesion, or hydronephrosis. Bladder is unremarkable. Stomach/Bowel: Stomach is within normal limits. There is circumferential thickening of the rectum, which appears increased compared to prior examination (series 2, image 25). Large burden of stool throughout the colon. Vascular/Lymphatic: Aortic atherosclerosis. There is a soft tissue nodule adjacent to the superior rectum measuring 1.5 cm, enlarged compared to prior examination, previously 1.0 cm (series 2, image 97, series 5, image 101). Reproductive: No mass or other abnormality. Other: No abdominal wall hernia or abnormality. No abdominopelvic ascites. Musculoskeletal: No acute or significant osseous findings. IMPRESSION: 1. Multiple bulky hypodense, rim enhancing masses of the liver, which are in keeping with findings of recent prior MRI and greatly enlarged compared to prior CT dated 11/09/2018. The largest index mass of the central right lobe of the liver measures 9.6 x 9.1 cm, previously 4.2 x 3.9 cm (series 2, image 50). Findings are consistent with hepatic metastatic disease. 2. There is circumferential thickening of the rectum, which appears increased compared to prior examination (series 2, image 25), in keeping with primary rectal malignancy. There is a soft tissue nodule adjacent to the superior rectum measuring 1.5 cm, enlarged compared to prior examination, previously 1.0 cm (series 2, image 97, series 5, image 101), consistent with perirectal nodal metastatic disease. 3. There is a 7 mm subpleural nodule of the anterior right middle lobe, significantly  enlarged compared to prior examination, previously 3 mm (series 4, image 114). This is presumably pulmonary metastatic disease. 4. Biapical pleuroparenchymal scarring and multiple small nodules of the bilateral lung apices, the largest a 6 mm pulmonary nodule of the right apex (series 4, image 36). These findings are likely sequelae of prior infection or inflammation. Attention on follow-up. 5.  Aortic Atherosclerosis (ICD10-I70.0). Electronically Signed   By: AEddie CandleM.D.   On: 02/01/2019 08:59   MR PELVIS WO CONTRAST  Result Date: 02/21/2019 CLINICAL DATA:  Newly diagnosed rectal carcinoma.  Local staging. EXAM: MRI PELVIS WITHOUT CONTRAST TECHNIQUE: Multiplanar multisequence MR imaging of the pelvis was performed. No intravenous contrast was administered. Small amount of [place] gel was administered per rectum to optimize tumor evaluation. COMPARISON:  None. FINDINGS: TUMOR LOCATION Tumor distance from Anal Verge/Skin Surface:  14.0 cm Tumor distance to Internal Anal Sphincter: 10.7 cm TUMOR DESCRIPTION Circumferential Extent: 100% circumferential Tumor Length: 4.1 cm T - CATEGORY Extension through Muscularis Propria: Yes, along right lateral and posterior wall measuring up to 32 mm = T3d Shortest Distance of any tumor/node from Mesorectal Fascia: 0 mm, tumor abuts the mesorectal fascia posteriorly in the presacral region Extramural Vascular Invasion/Tumor Thrombus: No Invasion of Anterior Peritoneal Reflection: No Involvement of Adjacent Organs or Pelvic Sidewall: No Levator Ani Involvement: No N - CATEGORY Mesorectal Lymph Nodes >=17m: 4 or more = N2 Extra-mesorectal Lymphadenopathy: No Other:  None. IMPRESSION: Rectal adenocarcinoma T stage: T3d Rectal adenocarcinoma N stage:  N2 Distance from tumor to the internal anal sphincter is 10.7 cm. Electronically Signed   By: JMarlaine HindM.D.   On: 02/21/2019 09:51   CT Abdomen Pelvis W Contrast  Result Date: 02/01/2019 CLINICAL DATA:  Metastatic rectal  cancer EXAM: CT CHEST, ABDOMEN, AND PELVIS WITH CONTRAST TECHNIQUE: Multidetector CT imaging of the chest, abdomen and pelvis was performed following the standard protocol during bolus administration of intravenous contrast. CONTRAST:  1082mISOVUE-300 IOPAMIDOL (ISOVUE-300) INJECTION 61%, additional oral enteric contrast COMPARISON:  MR abdomen, 01/25/2019, CT abdomen pelvis, 11/09/2018 FINDINGS: CT CHEST FINDINGS Cardiovascular: No significant vascular findings. Normal heart size. No pericardial effusion. Mediastinum/Nodes: No enlarged mediastinal, hilar, or axillary lymph nodes. Thyroid gland, trachea, and esophagus demonstrate no significant findings. Lungs/Pleura: Biapical pleuroparenchymal scarring and multiple small nodules of the bilateral lung apices, the largest a 6 mm pulmonary nodule of the right apex (series 4, image 36). There is a 7 mm subpleural nodule of the anterior right middle lobe, significantly enlarged compared to prior examination, previously 3 mm (series 4, image 114). No pleural effusion or pneumothorax. Musculoskeletal: No chest wall mass or suspicious bone lesions identified. CT ABDOMEN PELVIS FINDINGS Hepatobiliary: Multiple bulky hypodense, rim enhancing masses of the liver, which are in keeping with findings of recent prior MRI and greatly enlarged compared to prior CT dated 11/09/2018. The largest index mass of the central right lobe of the liver measures 9.6 x 9.1 cm, previously 4.2 x 3.9 cm (series 2, image 50). No gallstones, gallbladder wall thickening, or biliary dilatation. Pancreas: Unremarkable. No pancreatic ductal dilatation or surrounding inflammatory changes. Spleen: Normal in size without significant abnormality. Adrenals/Urinary Tract: Adrenal glands are unremarkable. Kidneys are normal, without renal calculi, solid lesion, or hydronephrosis. Bladder is unremarkable. Stomach/Bowel: Stomach is within normal limits. There is circumferential thickening of the rectum, which  appears increased compared to prior examination (series 2, image 25). Large burden of stool throughout the colon. Vascular/Lymphatic: Aortic atherosclerosis. There is a soft tissue nodule adjacent to the superior rectum measuring 1.5 cm, enlarged compared to prior examination, previously 1.0 cm (series 2, image 97, series 5, image 101). Reproductive: No mass or other abnormality. Other: No abdominal wall hernia or abnormality. No abdominopelvic ascites. Musculoskeletal: No acute or significant osseous findings. IMPRESSION: 1. Multiple bulky hypodense, rim enhancing masses of the liver, which are in keeping with findings of recent prior MRI and greatly enlarged compared to prior CT dated 11/09/2018. The largest index mass of the central right lobe of  the liver measures 9.6 x 9.1 cm, previously 4.2 x 3.9 cm (series 2, image 50). Findings are consistent with hepatic metastatic disease. 2. There is circumferential thickening of the rectum, which appears increased compared to prior examination (series 2, image 25), in keeping with primary rectal malignancy. There is a soft tissue nodule adjacent to the superior rectum measuring 1.5 cm, enlarged compared to prior examination, previously 1.0 cm (series 2, image 97, series 5, image 101), consistent with perirectal nodal metastatic disease. 3. There is a 7 mm subpleural nodule of the anterior right middle lobe, significantly enlarged compared to prior examination, previously 3 mm (series 4, image 114). This is presumably pulmonary metastatic disease. 4. Biapical pleuroparenchymal scarring and multiple small nodules of the bilateral lung apices, the largest a 6 mm pulmonary nodule of the right apex (series 4, image 36). These findings are likely sequelae of prior infection or inflammation. Attention on follow-up. 5.  Aortic Atherosclerosis (ICD10-I70.0). Electronically Signed   By: Eddie Candle M.D.   On: 02/01/2019 08:59   [place] CORE BIOPSY (LIVER)  Result Date:  02/07/2019 CLINICAL DATA:  Rectal carcinoma.  Multiple liver lesions. EXAM: ULTRASOUND GUIDED CORE BIOPSY OF LIVER LESION MEDICATIONS: Intravenous Fentanyl 46mg and Versed 14mwere administered as conscious sedation during continuous monitoring of the patient'Sharon level of consciousness and physiological / cardiorespiratory status by the radiology RN, with a total moderate sedation time of 10 minutes. PROCEDURE: The procedure, risks, benefits, and alternatives were explained to the patient. Questions regarding the procedure were encouraged and answered. The patient understands and consents to the procedure. A representative right lobe lesion was localized and appropriate skin entry site determined and marked. The operative field was prepped with chlorhexidine in a sterile fashion, and a sterile drape was applied covering the operative field. A sterile gown and sterile gloves were used for the procedure. Local anesthesia was provided with 1% Lidocaine. Under real-time ultrasound guidance, a 17 gauge trocar needle was advanced to the margin of the lesion. Once needle tip position was confirmed, coaxial 18-gauge core biopsy samples were obtained, submitted in formalin to surgical pathology. The guide needle was removed. Postprocedure scans show no hemorrhage or other apparent complication. The patient tolerated the procedure well. COMPLICATIONS: None. FINDINGS: Multiple large liver lesions were identified corresponding to recent CT findings. Representative core biopsy samples obtained IMPRESSION: 1. Technically successful ultrasound-guided core biopsy, liver lesion. Electronically Signed   By: D Lucrezia Europe.D.   On: 02/07/2019 13:52   IR IMAGING GUIDED PORT INSERTION  Result Date: 02/10/2019 CLINICAL DATA:  METASTATIC COLON CANCER, ACCESS FOR CHEMOTHERAPY EXAM: RIGHT INTERNAL JUGULAR SINGLE LUMEN POWER PORT CATHETER INSERTION Date:  02/10/2019 02/10/2019 10:20 am Radiologist:  M. TrDaryll BrodMD Guidance:  Ultrasound  and fluoroscopic MEDICATIONS: Ancef 2 g; The antibiotic was administered within an appropriate time interval prior to skin puncture. ANESTHESIA/SEDATION: Versed 2.0 mg IV; Fentanyl 100 mcg IV; Moderate Sedation Time:  24 minutes The patient was continuously monitored during the procedure by the interventional radiology nurse under my direct supervision. FLUOROSCOPY TIME:  0 minutes, 36 seconds (2 mGy) COMPLICATIONS: None immediate. CONTRAST:  None. PROCEDURE: Informed consent was obtained from the patient following explanation of the procedure, risks, benefits and alternatives. The patient understands, agrees and consents for the procedure. All questions were addressed. A time out was performed. Maximal barrier sterile technique utilized including caps, mask, sterile gowns, sterile gloves, large sterile drape, hand hygiene, and 2% chlorhexidine scrub. Under sterile conditions and local anesthesia, right internal  jugular micropuncture venous access was performed. Access was performed with ultrasound. Images were obtained for documentation of the patent right internal jugular vein. A guide wire was inserted followed by a transitional dilator. This allowed insertion of a guide wire and catheter into the IVC. Measurements were obtained from the SVC / RA junction back to the right IJ venotomy site. In the right infraclavicular chest, a subcutaneous pocket was created over the second anterior rib. This was done under sterile conditions and local anesthesia. 1% lidocaine with epinephrine was utilized for this. A 2.5 cm incision was made in the skin. Blunt dissection was performed to create a subcutaneous pocket over the right pectoralis major muscle. The pocket was flushed with saline vigorously. There was adequate hemostasis. The port catheter was assembled and checked for leakage. The port catheter was secured in the pocket with two retention sutures. The tubing was tunneled subcutaneously to the right venotomy site and  inserted into the SVC/RA junction through a valved peel-away sheath. Position was confirmed with fluoroscopy. Images were obtained for documentation. The patient tolerated the procedure well. No immediate complications. Incisions were closed in a two layer fashion with 4 - 0 Vicryl suture. Dermabond was applied to the skin. The port catheter was accessed, blood was aspirated followed by saline and heparin flushes. Needle was removed. A dry sterile dressing was applied. IMPRESSION: Ultrasound and fluoroscopically guided right internal jugular single lumen power port catheter insertion. Tip in the SVC/RA junction. Catheter ready for use. Electronically Signed   By: [name].  Shick M.D.   On: 02/10/2019 10:56

## 2019-02-28 NOTE — Telephone Encounter (Signed)
Per 2/16 los  No change

## 2019-02-28 NOTE — Patient Instructions (Signed)
Implanted Port Insertion, Care After °This sheet gives you information about how to care for yourself after your procedure. Your health care provider may also give you more specific instructions. If you have problems or questions, contact your health care provider. °What can I expect after the procedure? °After the procedure, it is common to have: °· Discomfort at the port insertion site. °· Bruising on the skin over the port. This should improve over 3-4 days. °Follow these instructions at home: °Port care °· After your port is placed, you will get a manufacturer's information card. The card has information about your port. Keep this card with you at all times. °· Take care of the port as told by your health care provider. Ask your health care provider if you or a family member can get training for taking care of the port at home. A home health care nurse may also take care of the port. °· Make sure to remember what type of port you have. °Incision care ° °  ° °· Follow instructions from your health care provider about how to take care of your port insertion site. Make sure you: °? Wash your hands with soap and water before and after you change your bandage (dressing). If soap and water are not available, use hand sanitizer. °? Change your dressing as told by your health care provider. °? Leave stitches (sutures), skin glue, or adhesive strips in place. These skin closures may need to stay in place for 2 weeks or longer. If adhesive strip edges start to loosen and curl up, you may trim the loose edges. Do not remove adhesive strips completely unless your health care provider tells you to do that. °· Check your port insertion site every day for signs of infection. Check for: °? Redness, swelling, or pain. °? Fluid or blood. °? Warmth. °? Pus or a bad smell. °Activity °· Return to your normal activities as told by your health care provider. Ask your health care provider what activities are safe for you. °· Do not  lift anything that is heavier than 10 lb (4.5 kg), or the limit that you are told, until your health care provider says that it is safe. °General instructions °· Take over-the-counter and prescription medicines only as told by your health care provider. °· Do not take baths, swim, or use a hot tub until your health care provider approves. Ask your health care provider if you may take showers. You may only be allowed to take sponge baths. °· Do not drive for 24 hours if you were given a sedative during your procedure. °· Wear a medical alert bracelet in case of an emergency. This will tell any health care providers that you have a port. °· Keep all follow-up visits as told by your health care provider. This is important. °Contact a health care provider if: °· You cannot flush your port with saline as directed, or you cannot draw blood from the port. °· You have a fever or chills. °· You have redness, swelling, or pain around your port insertion site. °· You have fluid or blood coming from your port insertion site. °· Your port insertion site feels warm to the touch. °· You have pus or a bad smell coming from the port insertion site. °Get help right away if: °· You have chest pain or shortness of breath. °· You have bleeding from your port that you cannot control. °Summary °· Take care of the port as told by your health   care provider. Keep the manufacturer's information card with you at all times. °· Change your dressing as told by your health care provider. °· Contact a health care provider if you have a fever or chills or if you have redness, swelling, or pain around your port insertion site. °· Keep all follow-up visits as told by your health care provider. °This information is not intended to replace advice given to you by your health care provider. Make sure you discuss any questions you have with your health care provider. °Document Revised: 07/27/2017 Document Reviewed: 07/27/2017 °Elsevier Patient Education ©  2020 [employer] ° °

## 2019-02-28 NOTE — Progress Notes (Signed)
Patient is here for cycle 2. She tolerated cycle one well without significant side effects. She is ready for cycle two.  She states her disability paperwork was denied as it didn't have a start or end date. She would like the start date of 02/14/19 and for now, the end date of her last planned chemo. She states her case worker stated the end date could be changed if needed in the future.  Spoke to [place] and requested that the dates be updated as specified above. She stated she would re-submit. Patient updated to our conversation.

## 2019-03-02 ENCOUNTER — Other Ambulatory Visit: Payer: Self-pay

## 2019-03-02 ENCOUNTER — Inpatient Hospital Stay: Payer: 59

## 2019-03-02 VITALS — BP 107/61 | HR 80 | Resp 16

## 2019-03-02 DIAGNOSIS — C20 Malignant neoplasm of rectum: Secondary | ICD-10-CM | POA: Diagnosis not present

## 2019-03-02 DIAGNOSIS — Z5111 Encounter for antineoplastic chemotherapy: Secondary | ICD-10-CM | POA: Diagnosis not present

## 2019-03-02 DIAGNOSIS — C787 Secondary malignant neoplasm of liver and intrahepatic bile duct: Secondary | ICD-10-CM | POA: Diagnosis not present

## 2019-03-02 DIAGNOSIS — Z452 Encounter for adjustment and management of vascular access device: Secondary | ICD-10-CM | POA: Diagnosis not present

## 2019-03-02 MED ORDER — HEPARIN SOD (PORK) LOCK FLUSH 100 UNIT/ML IV SOLN
500.0000 [IU] | Freq: Once | INTRAVENOUS | Status: AC | PRN
  Administered 2019-03-02: 500 [IU]
  Filled 2019-03-02: qty 5

## 2019-03-02 MED ORDER — SODIUM CHLORIDE 0.9% FLUSH
10.0000 mL | INTRAVENOUS | Status: DC | PRN
  Administered 2019-03-02: 10 mL
  Filled 2019-03-02: qty 10

## 2019-03-02 NOTE — Patient Instructions (Signed)
Fluorouracil, 5-FU injection What is this medicine? FLUOROURACIL, 5-FU (flure oh YOOR a sil) is a chemotherapy drug. It slows the growth of cancer cells. This medicine is used to treat many types of cancer like breast cancer, colon or rectal cancer, pancreatic cancer, and stomach cancer. This medicine may be used for other purposes; ask your health care provider or pharmacist if you have questions. COMMON BRAND NAME(S): Adrucil What should I tell my health care provider before I take this medicine? They need to know if you have any of these conditions:  blood disorders  dihydropyrimidine dehydrogenase (DPD) deficiency  infection (especially a virus infection such as chickenpox, cold sores, or herpes)  kidney disease  liver disease  malnourished, poor nutrition  recent or ongoing radiation therapy  an unusual or allergic reaction to fluorouracil, other chemotherapy, other medicines, foods, dyes, or preservatives  pregnant or trying to get pregnant  breast-feeding How should I use this medicine? This drug is given as an infusion or injection into a vein. It is administered in a hospital or clinic by a specially trained health care professional. Talk to your pediatrician regarding the use of this medicine in children. Special care may be needed. Overdosage: If you think you have taken too much of this medicine contact a poison control center or emergency room at once. NOTE: This medicine is only for you. Do not share this medicine with others. What if I miss a dose? It is important not to miss your dose. Call your doctor or health care professional if you are unable to keep an appointment. What may interact with this medicine?  allopurinol  cimetidine  dapsone  digoxin  hydroxyurea  leucovorin  levamisole  medicines for seizures like ethotoin, fosphenytoin, phenytoin  medicines to increase blood counts like filgrastim, pegfilgrastim, sargramostim  medicines that  treat or prevent blood clots like warfarin, enoxaparin, and dalteparin  methotrexate  metronidazole  pyrimethamine  some other chemotherapy drugs like busulfan, cisplatin, estramustine, vinblastine  trimethoprim  trimetrexate  vaccines Talk to your doctor or health care professional before taking any of these medicines:  acetaminophen  aspirin  ibuprofen  ketoprofen  naproxen This list may not describe all possible interactions. Give your health care provider a list of all the medicines, herbs, non-prescription drugs, or dietary supplements you use. Also tell them if you smoke, drink alcohol, or use illegal drugs. Some items may interact with your medicine. What should I watch for while using this medicine? Visit your doctor for checks on your progress. This drug may make you feel generally unwell. This is not uncommon, as chemotherapy can affect healthy cells as well as cancer cells. Report any side effects. Continue your course of treatment even though you feel ill unless your doctor tells you to stop. In some cases, you may be given additional medicines to help with side effects. Follow all directions for their use. Call your doctor or health care professional for advice if you get a fever, chills or sore throat, or other symptoms of a cold or flu. Do not treat yourself. This drug decreases your body's ability to fight infections. Try to avoid being around people who are sick. This medicine may increase your risk to bruise or bleed. Call your doctor or health care professional if you notice any unusual bleeding. Be careful brushing and flossing your teeth or using a toothpick because you may get an infection or bleed more easily. If you have any dental work done, tell your dentist you are   receiving this medicine. Avoid taking products that contain aspirin, acetaminophen, ibuprofen, naproxen, or ketoprofen unless instructed by your doctor. These medicines may hide a fever. Do not  become pregnant while taking this medicine. Women should inform their doctor if they wish to become pregnant or think they might be pregnant. There is a potential for serious side effects to an unborn child. Talk to your health care professional or pharmacist for more information. Do not breast-feed an infant while taking this medicine. Men should inform their doctor if they wish to father a child. This medicine may lower sperm counts. Do not treat diarrhea with over the counter products. Contact your doctor if you have diarrhea that lasts more than 2 days or if it is severe and watery. This medicine can make you more sensitive to the sun. Keep out of the sun. If you cannot avoid being in the sun, wear protective clothing and use sunscreen. Do not use sun lamps or tanning beds/booths. What side effects may I notice from receiving this medicine? Side effects that you should report to your doctor or health care professional as soon as possible:  allergic reactions like skin rash, itching or hives, swelling of the face, lips, or tongue  low blood counts - this medicine may decrease the number of white blood cells, red blood cells and platelets. You may be at increased risk for infections and bleeding.  signs of infection - fever or chills, cough, sore throat, pain or difficulty passing urine  signs of decreased platelets or bleeding - bruising, pinpoint red spots on the skin, black, tarry stools, blood in the urine  signs of decreased red blood cells - unusually weak or tired, fainting spells, lightheadedness  breathing problems  changes in vision  chest pain  mouth sores  nausea and vomiting  pain, swelling, redness at site where injected  pain, tingling, numbness in the hands or feet  redness, swelling, or sores on hands or feet  stomach pain  unusual bleeding Side effects that usually do not require medical attention (report to your doctor or health care professional if they  continue or are bothersome):  changes in finger or toe nails  diarrhea  dry or itchy skin  hair loss  headache  loss of appetite  sensitivity of eyes to the light  stomach upset  unusually teary eyes This list may not describe all possible side effects. Call your doctor for medical advice about side effects. You may report side effects to FDA at 1-800-FDA-1088. Where should I keep my medicine? This drug is given in a hospital or clinic and will not be stored at home. NOTE: This sheet is a summary. It may not cover all possible information. If you have questions about this medicine, talk to your doctor, pharmacist, or health care provider.  2020 [employer]vier/Gold Standard (2007-05-04 13:53:16)  

## 2019-03-06 ENCOUNTER — Other Ambulatory Visit: Payer: Self-pay | Admitting: Hematology

## 2019-03-07 ENCOUNTER — Encounter: Payer: Self-pay | Admitting: *Deleted

## 2019-03-07 MED FILL — PROCTOZONE-HC 2.5 % CREA: 2.5 | 20 days supply | Qty: 30 | Fill #0

## 2019-03-07 NOTE — Progress Notes (Signed)
Patient has two concerns.  Her Disability paperwork has been denied again due to not getting information from this office. I reached out to our disability specialist and requested that she call patient to personally work through the issue. Patient also forwarded me with the information via email. I forwarded the email to Otilio Carpen to give her an additional way to communicate with [employer].  Patient is also having difficulty with multiple stools, which are loose, and abdominal cramping. She is on both Senna and Miralax. Instructed the patient to stop the senna and just take miralax. She can wait 24-48h and see if this helps. If she is still having loose stools she can decrease her Miralax to once daily. She is also instructed to add Senna back, at once daily if she feels like she is getting constipated. She understood the instruction.

## 2019-03-14 ENCOUNTER — Encounter: Payer: Self-pay | Admitting: Hematology

## 2019-03-14 ENCOUNTER — Encounter: Payer: Self-pay | Admitting: *Deleted

## 2019-03-14 ENCOUNTER — Other Ambulatory Visit: Payer: Self-pay

## 2019-03-14 ENCOUNTER — Inpatient Hospital Stay: Payer: 59

## 2019-03-14 ENCOUNTER — Other Ambulatory Visit: Payer: Self-pay | Admitting: Family

## 2019-03-14 ENCOUNTER — Inpatient Hospital Stay: Payer: 59 | Attending: Hematology

## 2019-03-14 ENCOUNTER — Inpatient Hospital Stay (HOSPITAL_BASED_OUTPATIENT_CLINIC_OR_DEPARTMENT_OTHER): Payer: 59 | Admitting: Hematology

## 2019-03-14 VITALS — BP 107/66 | HR 75 | Temp 96.9°F | Resp 16 | Ht 68.0 in | Wt 124.0 lb

## 2019-03-14 DIAGNOSIS — C20 Malignant neoplasm of rectum: Secondary | ICD-10-CM

## 2019-03-14 DIAGNOSIS — R110 Nausea: Secondary | ICD-10-CM | POA: Diagnosis not present

## 2019-03-14 DIAGNOSIS — R7989 Other specified abnormal findings of blood chemistry: Secondary | ICD-10-CM | POA: Insufficient documentation

## 2019-03-14 DIAGNOSIS — C787 Secondary malignant neoplasm of liver and intrahepatic bile duct: Secondary | ICD-10-CM | POA: Insufficient documentation

## 2019-03-14 DIAGNOSIS — T451X5A Adverse effect of antineoplastic and immunosuppressive drugs, initial encounter: Secondary | ICD-10-CM

## 2019-03-14 DIAGNOSIS — Z5111 Encounter for antineoplastic chemotherapy: Secondary | ICD-10-CM | POA: Diagnosis not present

## 2019-03-14 DIAGNOSIS — G893 Neoplasm related pain (acute) (chronic): Secondary | ICD-10-CM | POA: Diagnosis not present

## 2019-03-14 LAB — CBC WITH DIFFERENTIAL (CANCER CENTER ONLY)
Abs Immature Granulocytes: 0.01 10*3/uL (ref 0.00–0.07)
Basophils Absolute: 0 10*3/uL (ref 0.0–0.1)
Basophils Relative: 1 %
Eosinophils Absolute: 0.1 10*3/uL (ref 0.0–0.5)
Eosinophils Relative: 2 %
HCT: 39.2 % (ref 36.0–46.0)
Hemoglobin: 13.3 g/dL (ref 12.0–15.0)
Immature Granulocytes: 0 %
Lymphocytes Relative: 24 %
Lymphs Abs: 1.1 10*3/uL (ref 0.7–4.0)
MCH: 30.4 pg (ref 26.0–34.0)
MCHC: 33.9 g/dL (ref 30.0–36.0)
MCV: 89.5 fL (ref 80.0–100.0)
Monocytes Absolute: 0.6 10*3/uL (ref 0.1–1.0)
Monocytes Relative: 13 %
Neutro Abs: 2.8 10*3/uL (ref 1.7–7.7)
Neutrophils Relative %: 60 %
Platelet Count: 170 10*3/uL (ref 150–400)
RBC: 4.38 MIL/uL (ref 3.87–5.11)
RDW: 13.2 % (ref 11.5–15.5)
WBC Count: 4.7 10*3/uL (ref 4.0–10.5)
nRBC: 0 % (ref 0.0–0.2)

## 2019-03-14 LAB — CMP (CANCER CENTER ONLY)
ALT: 29 U/L (ref 0–44)
AST: 32 U/L (ref 15–41)
Albumin: 4.3 g/dL (ref 3.5–5.0)
Alkaline Phosphatase: 193 U/L — ABNORMAL HIGH (ref 38–126)
Anion gap: 8 (ref 5–15)
BUN: 7 mg/dL (ref 6–20)
CO2: 29 mmol/L (ref 22–32)
Calcium: 9.6 mg/dL (ref 8.9–10.3)
Chloride: 103 mmol/L (ref 98–111)
Creatinine: 0.7 mg/dL (ref 0.44–1.00)
GFR, Est AFR Am: >60 mL/min (ref >60)
GFR, Estimated: >60 mL/min (ref >60)
Glucose, Bld: 111 mg/dL — ABNORMAL HIGH (ref 70–99)
Potassium: 3.5 mmol/L (ref 3.5–5.1)
Sodium: 140 mmol/L (ref 135–145)
Total Bilirubin: 0.7 mg/dL (ref 0.3–1.2)
Total Protein: 6.8 g/dL (ref 6.5–8.1)

## 2019-03-14 MED ORDER — SODIUM CHLORIDE 0.9 % IV SOLN
2400.0000 mg/m2 | INTRAVENOUS | Status: DC
  Administered 2019-03-14: 4000 mg via INTRAVENOUS
  Filled 2019-03-14: qty 80

## 2019-03-14 MED ORDER — DEXAMETHASONE SODIUM PHOSPHATE 10 MG/ML IJ SOLN
INTRAMUSCULAR | Status: AC
  Filled 2019-03-14: qty 1

## 2019-03-14 MED ORDER — OXALIPLATIN CHEMO INJECTION 100 MG/20ML
85.0000 mg/m2 | Freq: Once | INTRAVENOUS | Status: AC
  Administered 2019-03-14: 140 mg via INTRAVENOUS
  Filled 2019-03-14: qty 20

## 2019-03-14 MED ORDER — DEXTROSE 5 % IV SOLN
Freq: Once | INTRAVENOUS | Status: AC
  Filled 2019-03-14: qty 250

## 2019-03-14 MED ORDER — PALONOSETRON HCL INJECTION 0.25 MG/5ML
0.2500 mg | Freq: Once | INTRAVENOUS | Status: AC
  Administered 2019-03-14: 0.25 mg via INTRAVENOUS

## 2019-03-14 MED ORDER — FLUOROURACIL CHEMO INJECTION 2.5 GM/50ML
400.0000 mg/m2 | Freq: Once | INTRAVENOUS | Status: AC
  Administered 2019-03-14: 650 mg via INTRAVENOUS
  Filled 2019-03-14: qty 13

## 2019-03-14 MED ORDER — DEXAMETHASONE SODIUM PHOSPHATE 10 MG/ML IJ SOLN
10.0000 mg | Freq: Once | INTRAMUSCULAR | Status: AC
  Administered 2019-03-14: 10 mg via INTRAVENOUS

## 2019-03-14 MED ORDER — PALONOSETRON HCL INJECTION 0.25 MG/5ML
INTRAVENOUS | Status: AC
  Filled 2019-03-14: qty 5

## 2019-03-14 MED ORDER — LEUCOVORIN CALCIUM INJECTION 350 MG
400.0000 mg/m2 | Freq: Once | INTRAVENOUS | Status: AC
  Administered 2019-03-14: 668 mg via INTRAVENOUS
  Filled 2019-03-14: qty 33.4

## 2019-03-14 NOTE — Patient Instructions (Signed)
[facility] Discharge Instructions for Patients Receiving Chemotherapy  Today you received the following chemotherapy agents 5FU, Oxaliplatin  To help prevent nausea and vomiting after your treatment, we encourage you to take your nausea medication    If you develop nausea and vomiting that is not controlled by your nausea medication, call the clinic.   BELOW ARE SYMPTOMS THAT SHOULD BE REPORTED IMMEDIATELY:  *FEVER GREATER THAN 100.5 F  *CHILLS WITH OR WITHOUT FEVER  NAUSEA AND VOMITING THAT IS NOT CONTROLLED WITH YOUR NAUSEA MEDICATION  *UNUSUAL SHORTNESS OF BREATH  *UNUSUAL BRUISING OR BLEEDING  TENDERNESS IN MOUTH AND THROAT WITH OR WITHOUT PRESENCE OF ULCERS  *URINARY PROBLEMS  *BOWEL PROBLEMS  UNUSUAL RASH Items with * indicate a potential emergency and should be followed up as soon as possible.  Feel free to call the clinic should you have any questions or concerns. The clinic phone number is [phone].  Please show the CHEMO ALERT CARD at check-in to the Emergency Department and triage nurse.   

## 2019-03-14 NOTE — Progress Notes (Signed)
[facility] OFFICE PROGRESS NOTE  Patient Care Team: Shirley, [place], DO as PCP - General (Family Medicine) Tish Men, MD as Medical Oncologist (Hematology) Cordelia Poche, RN as Oncology Nurse Navigator  HEME/ONC OVERVIEW: 1. Metastatic rectal adenocarcinoma to the liver and likely lungs; MMR proficient, KRAS mutated, HER2 amplified  -10/2018: multiple liver masses on CT abdomen, largest 4.6x4.2cm, suggestive of hemangiomas; no definite primary malignancy;  -01/2019:   A large rectal mass obstructing the proximal rectum, ~10cm from the anal verge, circumferential; bx'ed, adenocarcinoma  Numerous masses involving all segments of liver, largest ~8cm, as well as enlarging subcentimeter pulmonary nodules  Liver bx showed adenocarcinoma, c/w rectal primary; MSI stable, KRAS mutated, HER2 amplified  -02/2019 - present: 1st line FOLFOX   TREATMENT SUMMARY:  02/14/2019 - present: 1st line FOLFOX   ASSESSMENT & PLAN:   Metastatic rectal adenocarcinoma to the liver and likely lungs; MMR proficient, KRAS mutated, HER2 amplified  -S/p 2 cycles of FOLFOX in the 1st line setting; not a candidate for Vectibix due to KRAS mutation or Avastin due to near obstruction in the rectum  -Labs adequate, proceed with Cycle 3 of FOLFOX -I have ordered CT chest, abdomen and pelvis w/ contrast after Cycle 4 of chemotherapy to assess disease response  -I have also reached out to Dr. Reynaldo Minium at [facility] regarding any clinical trial for HER2 amplified rectal cancer in the 2nd line setting or beyond  -PRN anti-emetics: Zofran, Compazine, dexamethasone   Cancer-related pain -Secondary to rectal adenocarcinoma and extensive liver metastases -Pain overall improving, only requiring PRN Norco -Continue the current regimen for now  -We discussed the importance of maintaining soft stools on opioid medications   Chemotherapy-associated nausea  -Secondary to chemotherapy -Symptoms relatively well  controlled  -Continue PRN-anti-emetics   Elevated LFT's  -Likely secondary to extensive liver masses -Alk phos 193, stable; AST, ALT and Tbili normal  -We will monitor it for now   Orders Placed This Encounter  Procedures  . CT CHEST W CONTRAST    Standing Status:   Future    Standing Expiration Date:   03/13/2020    Scheduling Instructions:     Pls schedule prior to 04/11/2019    Order Specific Question:   ** REASON FOR EXAM (FREE TEXT)    Answer:   S/p 4 cycles of FOLFOX    Order Specific Question:   If indicated for the ordered procedure, I authorize the administration of contrast media per Radiology protocol    Answer:   Yes    Order Specific Question:   Preferred imaging location?    Answer:   [facility] Specific Question:   Radiology Contrast Protocol - do NOT remove file path    Answer:   \\[email or web address]    Order Specific Question:   Is patient pregnant?    Answer:   No  . CT ABDOMEN PELVIS W CONTRAST    Standing Status:   Future    Standing Expiration Date:   03/13/2020    Order Specific Question:   If indicated for the ordered procedure, I authorize the administration of contrast media per Radiology protocol    Answer:   Yes    Order Specific Question:   Preferred imaging location?    Answer:   [facility] Specific Question:   Is Oral Contrast requested for this exam?    Answer:   Yes, Per Radiology protocol    Order Specific  Question:   Radiology Contrast Protocol - do NOT remove file path    Answer:   \\[email or web address]    Order Specific Question:   Is patient pregnant?    Answer:   No   All questions were answered. The patient knows to call the clinic with any problems, questions or concerns. No barriers to learning was detected.  Return in 2 weeks for Cycle 4 of chemotherapy, and 4 weeks for CT results and chemotherapy.   Tish Men, MD 3/2/202110:30 AM  CHIEF COMPLAINT: "I  am doing fine so far"  INTERVAL HISTORY: Ms. Sotto returns to clinic for follow-up of metastatic rectal cancer on first-line FOLFOX.  Patient reports that she had 1 day of nausea after chemotherapy, which resolved with antiemetics.  She also has had a few days of loose stool, for which she has been adjusting her bowel regimen as needed.  She has not had any significant abdominal pain, and only requires Norco as needed.  She has mild cold sensitivity after each dose of chemotherapy, which resolves after a few days, but she denies any persistent numbness, tingling, or burning sensation in hands or feet.  She denies any other complaints today.  REVIEW OF SYSTEMS:   Constitutional: ( - ) fevers, ( - )  chills , ( - ) night sweats Eyes: ( - ) blurriness of vision, ( - ) double vision, ( - ) watery eyes Ears, nose, mouth, throat, and face: ( - ) mucositis, ( - ) sore throat Respiratory: ( - ) cough, ( - ) dyspnea, ( - ) wheezes Cardiovascular: ( - ) palpitation, ( - ) chest discomfort, ( - ) lower extremity swelling Gastrointestinal:  ( + ) nausea, ( - ) heartburn, ( - ) change in bowel habits Skin: ( - ) abnormal skin rashes Lymphatics: ( - ) new lymphadenopathy, ( - ) easy bruising Neurological: ( - ) numbness, ( - ) tingling, ( - ) new weaknesses Behavioral/Psych: ( - ) mood change, ( - ) new changes  All other systems were reviewed with the patient and are negative.  SUMMARY OF ONCOLOGIC HISTORY: Oncology History  Rectal adenocarcinoma ([place])  11/09/2018 Imaging   CT abdomen/pelvis w/ contrast: IMPRESSION: 1. Diffuse stool throughout the colon. No bowel obstruction. No abscess in the abdomen or pelvis. Appendix absent.   2. Multiple liver masses, likely multiple hemangiomas. Note that these lesions on current examination do not have classic hemangioma appearance. Given the size and multiplicity of these lesions, further assessment nonemergently would be advisable. In this regard, pre and  serial post-contrast MR of the liver would be the optimum imaging study of choice to further evaluate. If there is a contraindication to MR, pre and serial post-contrast CT of the liver would be an alternative for further assessment.   3. 3 mm nodular opacity in the right middle lobe. No follow-up needed if patient is low-risk. Non-contrast chest CT can be considered in 12 months if patient is high-risk. This recommendation follows the consensus statement: Guidelines for Management of Incidental Pulmonary Nodules Detected on CT Images: From the Fleischner Society 2017; Radiology 2017; 284:228-243.   4. No evident renal or ureteral calculus. No hydronephrosis. Urinary bladder wall thickness normal.   5.  Aortic Atherosclerosis (ICD10-I70.0).   01/20/2019 Procedure   Colonoscopy: - Hemorrhoids were found on perianal exam. - A frond-like/villous, fungating and ulcerated completely obstructing large mass was found in the proximal rectum, located approximately 10 cm from the anal verge. The mass  was circumferential and non-traversable. Oozing was present. This was extensively biopsied with a cold forceps for histology. Path was sent as rush. Estimated blood loss was minimal. - Non-bleeding internal hemorrhoids were found during retroflexion. The hemorrhoids were large.   01/20/2019 Imaging   EGD: - The examined esophagus was normal. - The entire examined stomach was normal. Biopsies were taken with a cold forceps for Helicobacter pylori testing. Estimated blood loss was minimal. - The duodenal bulb, first portion of the duodenum and second portion of the duodenum were normal. Biopsies were taken with a cold forceps for histology. Estimated blood loss was minimal.   01/24/2019 Initial Diagnosis   Rectal adenocarcinoma ([place])   01/25/2019 Imaging   MRI abdomen: IMPRESSION: 1. Marked worsening of what is now characterized as hepatic metastatic disease, in this patient who now is known to  have rectal cancer.   02/01/2019 Imaging   CT chest: IMPRESSION: 1. Multiple bulky hypodense, rim enhancing masses of the liver, which are in keeping with findings of recent prior MRI and greatly enlarged compared to prior CT dated 11/09/2018. The largest index mass of the central right lobe of the liver measures 9.6 x 9.1 cm, previously 4.2 x 3.9 cm (series 2, image 50). Findings are consistent with hepatic metastatic disease. 2. There is circumferential thickening of the rectum, which appears increased compared to prior examination (series 2, image 25), in keeping with primary rectal malignancy. There is a soft tissue nodule adjacent to the superior rectum measuring 1.5 cm, enlarged compared to prior examination, previously 1.0 cm (series 2, image 97, series 5, image 101), consistent with perirectal nodal metastatic disease. 3. There is a 7 mm subpleural nodule of the anterior right middle lobe, significantly enlarged compared to prior examination, previously 3 mm (series 4, image 114). This is presumably pulmonary metastatic disease. 4. Biapical pleuroparenchymal scarring and multiple small nodules of the bilateral lung apices, the largest a 6 mm pulmonary nodule of the right apex (series 4, image 36). These findings are likely sequelae of prior infection or inflammation. Attention on follow-up. 5.  Aortic Atherosclerosis (ICD10-I70.0).   02/01/2019 Imaging   IMPRESSION: 1. Multiple bulky hypodense, rim enhancing masses of the liver, which are in keeping with findings of recent prior MRI and greatly enlarged compared to prior CT dated 11/09/2018. The largest index mass of the central right lobe of the liver measures 9.6 x 9.1 cm, previously 4.2 x 3.9 cm (series 2, image 50). Findings are consistent with hepatic metastatic disease. 2. There is circumferential thickening of the rectum, which appears increased compared to prior examination (series 2, image 25), in keeping with  primary rectal malignancy. There is a soft tissue nodule adjacent to the superior rectum measuring 1.5 cm, enlarged compared to prior examination, previously 1.0 cm (series 2, image 97, series 5, image 101), consistent with perirectal nodal metastatic disease. 3. There is a 7 mm subpleural nodule of the anterior right middle lobe, significantly enlarged compared to prior examination, previously 3 mm (series 4, image 114). This is presumably pulmonary metastatic disease. 4. Biapical pleuroparenchymal scarring and multiple small nodules of the bilateral lung apices, the largest a 6 mm pulmonary nodule of the right apex (series 4, image 36). These findings are likely sequelae of prior infection or inflammation. Attention on follow-up. 5.  Aortic Atherosclerosis (ICD10-I70.0).   02/07/2019 Pathology Results   FINAL MICROSCOPIC DIAGNOSIS:   A. LIVER, BIOPSY:  - Metastatic adenocarcinoma to the liver, consistent with patient's  clinical history of primary  rectal adenocarcinoma.  See comment   02/09/2019 Cancer Staging   Staging form: Colon and Rectum, AJCC 8th Edition - Clinical: Stage Unknown (cTX, cNX, cM1) - Signed by Tish Men, MD on 02/09/2019   02/14/2019 -  Chemotherapy   The patient had palonosetron (ALOXI) injection 0.25 mg, 0.25 mg, Intravenous,  Once, 3 of 8 cycles Administration: 0.25 mg (02/14/2019), 0.25 mg (02/28/2019) leucovorin 668 mg in dextrose 5 % 250 mL infusion, 400 mg/m2 = 668 mg, Intravenous,  Once, 3 of 8 cycles Administration: 668 mg (02/14/2019), 668 mg (02/28/2019) oxaliplatin (ELOXATIN) 140 mg in dextrose 5 % 500 mL chemo infusion, 85 mg/m2 = 140 mg, Intravenous,  Once, 3 of 8 cycles Administration: 140 mg (02/14/2019), 140 mg (02/28/2019) fluorouracil (ADRUCIL) chemo injection 650 mg, 400 mg/m2 = 650 mg, Intravenous,  Once, 3 of 8 cycles Administration: 650 mg (02/14/2019), 650 mg (02/28/2019) fluorouracil (ADRUCIL) 4,000 mg in sodium chloride 0.9 % 70 mL chemo infusion,  2,400 mg/m2 = 4,000 mg, Intravenous, 1 Day/Dose, 3 of 8 cycles Administration: 4,000 mg (02/14/2019), 4,000 mg (02/28/2019)  for chemotherapy treatment.      I have reviewed the past medical history, past surgical history, social history and family history with the patient and they are unchanged from previous note.  ALLERGIES:  has No Known Allergies.  MEDICATIONS:  Current Outpatient Medications  Medication Sig Dispense Refill  . AMBULATORY NON FORMULARY MEDICATION 2 tablets 2 (two) times daily. [place]    . dexamethasone (DECADRON) 4 MG tablet Take 2 tablets (8 mg total) by mouth daily. Start the day after chemotherapy for 2 days. Take with food. 30 tablet 1  . HYDROcodone-acetaminophen (NORCO) 5-325 MG tablet Take 1 tablet by mouth every 6 (six) hours as needed for moderate pain. 50 tablet 0  . lidocaine-prilocaine (EMLA) cream Apply to affected area once 30 g 3  . LORazepam (ATIVAN) 0.5 MG tablet Take 1 tablet (0.5 mg total) by mouth every 6 (six) hours as needed (Nausea or vomiting). 30 tablet 0  . Lysine 500 MG TABS Take 1 tablet by mouth daily.    . Multiple Vitamin (MULTIVITAMIN WITH MINERALS) TABS tablet Take 1 tablet by mouth daily.    . ondansetron (ZOFRAN) 8 MG tablet Take 1 tablet (8 mg total) by mouth 2 (two) times daily as needed for refractory nausea / vomiting. Start on day 3 after chemotherapy. 30 tablet 1  . prochlorperazine (COMPAZINE) 10 MG tablet Take 1 tablet (10 mg total) by mouth every 6 (six) hours as needed (Nausea or vomiting). 30 tablet 1  . PROCTOZONE-HC 2.5 % rectal cream PLACE 1 APPLICATION RECTALLY 2 (TWO) TIMES DAILY. 30 g 0  . senna-docusate (SENNA-S) 8.6-50 MG tablet Take 1 tablet by mouth daily.    . polyethylene glycol powder (GLYCOLAX/MIRALAX) 17 GM/SCOOP powder Take 17 g by mouth 2 (two) times daily as needed. (Patient not taking: Reported on 03/14/2019) 3350 g 1   No current facility-administered medications for this visit.   Facility-Administered  Medications Ordered in Other Visits  Medication Dose Route Frequency Provider Last Rate Last Admin  . fluorouracil (ADRUCIL) 4,000 mg in sodium chloride 0.9 % 70 mL chemo infusion  2,400 mg/m2 (Treatment Plan Recorded) Intravenous 1 day or 1 dose Tish Men, MD      . fluorouracil (ADRUCIL) chemo injection 650 mg  400 mg/m2 (Treatment Plan Recorded) Intravenous Once Tish Men, MD      . leucovorin 668 mg in dextrose 5 % 250 mL infusion  400  mg/m2 (Treatment Plan Recorded) Intravenous Once Tish Men, MD      . oxaliplatin (ELOXATIN) 140 mg in dextrose 5 % 500 mL chemo infusion  85 mg/m2 (Treatment Plan Recorded) Intravenous Once Tish Men, MD        PHYSICAL EXAMINATION: ECOG PERFORMANCE STATUS: 0 - Asymptomatic  Today's Vitals   03/14/19 0937  BP: 107/66  Pulse: 75  Resp: 16  Temp: (!) 96.9 F (36.1 C)  TempSrc: Temporal  SpO2: 100%  Weight: 124 lb (56.2 kg)  Height: _0  (1.727 m)  PainSc: 0-No pain   Body mass index is 18.85 kg/m.  Filed Weights   03/14/19 0937  Weight: 124 lb (56.2 kg)    GENERAL: alert, no distress and comfortable, thin  SKIN: skin color, texture, turgor are normal, no rashes or significant lesions EYES: conjunctiva are pink and non-injected, sclera clear OROPHARYNX: no exudate, no erythema; lips, buccal mucosa, and tongue normal  NECK: supple, non-tender LUNGS: clear to auscultation with normal breathing effort HEART: regular rate & rhythm and no murmurs and no lower extremity edema ABDOMEN: soft, non-tender, non-distended, normal bowel sounds Musculoskeletal: no cyanosis of digits and no clubbing  PSYCH: alert & oriented x 3, fluent speech  LABORATORY DATA:  I have reviewed the data as listed    Component Value Date/Time   NA 140 03/14/2019 0930   NA 141 11/02/2018 1654   K 3.5 03/14/2019 0930   CL 103 03/14/2019 0930   CO2 29 03/14/2019 0930   GLUCOSE 111 (H) 03/14/2019 0930   BUN 7 03/14/2019 0930   BUN 15 11/02/2018 1654   CREATININE  0.70 03/14/2019 0930   CALCIUM 9.6 03/14/2019 0930   PROT 6.8 03/14/2019 0930   PROT 6.7 11/02/2018 1654   ALBUMIN 4.3 03/14/2019 0930   ALBUMIN 4.7 11/02/2018 1654   AST 32 03/14/2019 0930   ALT 29 03/14/2019 0930   ALKPHOS 193 (H) 03/14/2019 0930   BILITOT 0.7 03/14/2019 0930   GFRNONAA >60 03/14/2019 0930   GFRAA >60 03/14/2019 0930    No results found for: SPEP, UPEP  Lab Results  Component Value Date   WBC 4.7 03/14/2019   NEUTROABS 2.8 03/14/2019   HGB 13.3 03/14/2019   HCT 39.2 03/14/2019   MCV 89.5 03/14/2019   PLT 170 03/14/2019      Chemistry      Component Value Date/Time   NA 140 03/14/2019 0930   NA 141 11/02/2018 1654   K 3.5 03/14/2019 0930   CL 103 03/14/2019 0930   CO2 29 03/14/2019 0930   BUN 7 03/14/2019 0930   BUN 15 11/02/2018 1654   CREATININE 0.70 03/14/2019 0930      Component Value Date/Time   CALCIUM 9.6 03/14/2019 0930   ALKPHOS 193 (H) 03/14/2019 0930   AST 32 03/14/2019 0930   ALT 29 03/14/2019 0930   BILITOT 0.7 03/14/2019 0930       RADIOGRAPHIC STUDIES: I have personally reviewed the radiological images as listed below and agreed with the findings in the report. MR PELVIS WO CONTRAST  Result Date: 02/21/2019 CLINICAL DATA:  Newly diagnosed rectal carcinoma.  Local staging. EXAM: MRI PELVIS WITHOUT CONTRAST TECHNIQUE: Multiplanar multisequence MR imaging of the pelvis was performed. No intravenous contrast was administered. Small amount of [place] gel was administered per rectum to optimize tumor evaluation. COMPARISON:  None. FINDINGS: TUMOR LOCATION Tumor distance from Anal Verge/Skin Surface:  14.0 cm Tumor distance to Internal Anal Sphincter: 10.7 cm TUMOR DESCRIPTION Circumferential  Extent: 100% circumferential Tumor Length: 4.1 cm T - CATEGORY Extension through Muscularis Propria: Yes, along right lateral and posterior wall measuring up to 32 mm = T3d Shortest Distance of any tumor/node from Mesorectal Fascia: 0 mm, tumor abuts the  mesorectal fascia posteriorly in the presacral region Extramural Vascular Invasion/Tumor Thrombus: No Invasion of Anterior Peritoneal Reflection: No Involvement of Adjacent Organs or Pelvic Sidewall: No Levator Ani Involvement: No N - CATEGORY Mesorectal Lymph Nodes >=31m: 4 or more = N2 Extra-mesorectal Lymphadenopathy: No Other:  None. IMPRESSION: Rectal adenocarcinoma T stage: T3d Rectal adenocarcinoma N stage:  N2 Distance from tumor to the internal anal sphincter is 10.7 cm. Electronically Signed   By: JMarlaine HindM.D.   On: 02/21/2019 09:51

## 2019-03-14 NOTE — Progress Notes (Signed)
Confirmed with patient that her disability paperwork had been corrected and received. She did confirm. She also mentioned that she is trying to gain weight with use of supplements. Samples of Boost given to patient.

## 2019-03-14 NOTE — Patient Instructions (Signed)

## 2019-03-16 ENCOUNTER — Inpatient Hospital Stay: Payer: 59

## 2019-03-16 ENCOUNTER — Other Ambulatory Visit: Payer: Self-pay

## 2019-03-16 VITALS — BP 93/57 | HR 77 | Temp 97.5°F | Resp 16

## 2019-03-16 DIAGNOSIS — R7989 Other specified abnormal findings of blood chemistry: Secondary | ICD-10-CM | POA: Diagnosis not present

## 2019-03-16 DIAGNOSIS — C20 Malignant neoplasm of rectum: Secondary | ICD-10-CM | POA: Diagnosis not present

## 2019-03-16 DIAGNOSIS — C787 Secondary malignant neoplasm of liver and intrahepatic bile duct: Secondary | ICD-10-CM | POA: Diagnosis not present

## 2019-03-16 DIAGNOSIS — Z5111 Encounter for antineoplastic chemotherapy: Secondary | ICD-10-CM | POA: Diagnosis not present

## 2019-03-16 MED ORDER — HEPARIN SOD (PORK) LOCK FLUSH 100 UNIT/ML IV SOLN
500.0000 [IU] | Freq: Once | INTRAVENOUS | Status: AC | PRN
  Administered 2019-03-16: 500 [IU]
  Filled 2019-03-16: qty 5

## 2019-03-16 MED ORDER — SODIUM CHLORIDE 0.9% FLUSH
10.0000 mL | INTRAVENOUS | Status: DC | PRN
  Administered 2019-03-16: 10 mL
  Filled 2019-03-16: qty 10

## 2019-03-16 MED ORDER — SODIUM CHLORIDE 0.9 % IV SOLN
1000.0000 mL | Freq: Once | INTRAVENOUS | Status: AC
  Administered 2019-03-16: 1000 mL via INTRAVENOUS
  Filled 2019-03-16: qty 1000

## 2019-03-16 NOTE — Patient Instructions (Signed)

## 2019-03-16 NOTE — Progress Notes (Signed)
Pt here for pump d/c, BP low 87/52. IVF 1L NS given

## 2019-03-17 ENCOUNTER — Encounter: Payer: Self-pay | Admitting: *Deleted

## 2019-03-27 MED FILL — PROCTOZONE-HC 2.5 % CREA: 2.5 | 20 days supply | Qty: 30 | Fill #0

## 2019-03-28 ENCOUNTER — Encounter: Payer: Self-pay | Admitting: *Deleted

## 2019-03-28 ENCOUNTER — Inpatient Hospital Stay: Payer: 59

## 2019-03-28 ENCOUNTER — Other Ambulatory Visit: Payer: Self-pay

## 2019-03-28 DIAGNOSIS — R7989 Other specified abnormal findings of blood chemistry: Secondary | ICD-10-CM | POA: Diagnosis not present

## 2019-03-28 DIAGNOSIS — Z5111 Encounter for antineoplastic chemotherapy: Secondary | ICD-10-CM | POA: Diagnosis not present

## 2019-03-28 DIAGNOSIS — C20 Malignant neoplasm of rectum: Secondary | ICD-10-CM

## 2019-03-28 DIAGNOSIS — C787 Secondary malignant neoplasm of liver and intrahepatic bile duct: Secondary | ICD-10-CM | POA: Diagnosis not present

## 2019-03-28 LAB — CMP (CANCER CENTER ONLY)
ALT: 27 U/L (ref 0–44)
AST: 29 U/L (ref 15–41)
Albumin: 4.1 g/dL (ref 3.5–5.0)
Alkaline Phosphatase: 148 U/L — ABNORMAL HIGH (ref 38–126)
Anion gap: 6 (ref 5–15)
BUN: 11 mg/dL (ref 6–20)
CO2: 29 mmol/L (ref 22–32)
Calcium: 9.2 mg/dL (ref 8.9–10.3)
Chloride: 104 mmol/L (ref 98–111)
Creatinine: 0.65 mg/dL (ref 0.44–1.00)
GFR, Est AFR Am: >60 mL/min (ref >60)
GFR, Estimated: >60 mL/min (ref >60)
Glucose, Bld: 107 mg/dL — ABNORMAL HIGH (ref 70–99)
Potassium: 3.7 mmol/L (ref 3.5–5.1)
Sodium: 139 mmol/L (ref 135–145)
Total Bilirubin: 0.8 mg/dL (ref 0.3–1.2)
Total Protein: 6.1 g/dL — ABNORMAL LOW (ref 6.5–8.1)

## 2019-03-28 LAB — CBC WITH DIFFERENTIAL (CANCER CENTER ONLY)
Abs Immature Granulocytes: 0.01 10*3/uL (ref 0.00–0.07)
Basophils Absolute: 0 10*3/uL (ref 0.0–0.1)
Basophils Relative: 1 %
Eosinophils Absolute: 0.1 10*3/uL (ref 0.0–0.5)
Eosinophils Relative: 3 %
HCT: 36.9 % (ref 36.0–46.0)
Hemoglobin: 12.7 g/dL (ref 12.0–15.0)
Immature Granulocytes: 0 %
Lymphocytes Relative: 29 %
Lymphs Abs: 1 10*3/uL (ref 0.7–4.0)
MCH: 31 pg (ref 26.0–34.0)
MCHC: 34.4 g/dL (ref 30.0–36.0)
MCV: 90 fL (ref 80.0–100.0)
Monocytes Absolute: 0.5 10*3/uL (ref 0.1–1.0)
Monocytes Relative: 15 %
Neutro Abs: 1.7 10*3/uL (ref 1.7–7.7)
Neutrophils Relative %: 52 %
Platelet Count: 133 10*3/uL — ABNORMAL LOW (ref 150–400)
RBC: 4.1 MIL/uL (ref 3.87–5.11)
RDW: 14.2 % (ref 11.5–15.5)
WBC Count: 3.4 10*3/uL — ABNORMAL LOW (ref 4.0–10.5)
nRBC: 0 % (ref 0.0–0.2)

## 2019-03-28 MED ORDER — DEXAMETHASONE SODIUM PHOSPHATE 10 MG/ML IJ SOLN
INTRAMUSCULAR | Status: AC
  Filled 2019-03-28: qty 1

## 2019-03-28 MED ORDER — OXALIPLATIN CHEMO INJECTION 100 MG/20ML
85.0000 mg/m2 | Freq: Once | INTRAVENOUS | Status: AC
  Administered 2019-03-28: 140 mg via INTRAVENOUS
  Filled 2019-03-28: qty 20

## 2019-03-28 MED ORDER — DEXAMETHASONE SODIUM PHOSPHATE 10 MG/ML IJ SOLN
10.0000 mg | Freq: Once | INTRAMUSCULAR | Status: AC
  Administered 2019-03-28: 10 mg via INTRAVENOUS

## 2019-03-28 MED ORDER — PALONOSETRON HCL INJECTION 0.25 MG/5ML
INTRAVENOUS | Status: AC
  Filled 2019-03-28: qty 5

## 2019-03-28 MED ORDER — DEXTROSE 5 % IV SOLN
Freq: Once | INTRAVENOUS | Status: AC
  Filled 2019-03-28: qty 250

## 2019-03-28 MED ORDER — LEUCOVORIN CALCIUM INJECTION 350 MG
400.0000 mg/m2 | Freq: Once | INTRAVENOUS | Status: AC
  Administered 2019-03-28: 668 mg via INTRAVENOUS
  Filled 2019-03-28: qty 33.4

## 2019-03-28 MED ORDER — PALONOSETRON HCL INJECTION 0.25 MG/5ML
0.2500 mg | Freq: Once | INTRAVENOUS | Status: AC
  Administered 2019-03-28: 0.25 mg via INTRAVENOUS

## 2019-03-28 MED ORDER — SODIUM CHLORIDE 0.9 % IV SOLN
2400.0000 mg/m2 | INTRAVENOUS | Status: DC
  Administered 2019-03-28: 4000 mg via INTRAVENOUS
  Filled 2019-03-28: qty 80

## 2019-03-28 MED ORDER — FLUOROURACIL CHEMO INJECTION 2.5 GM/50ML
400.0000 mg/m2 | Freq: Once | INTRAVENOUS | Status: AC
  Administered 2019-03-28: 650 mg via INTRAVENOUS
  Filled 2019-03-28: qty 13

## 2019-03-30 ENCOUNTER — Inpatient Hospital Stay: Payer: 59

## 2019-03-30 ENCOUNTER — Other Ambulatory Visit: Payer: Self-pay

## 2019-03-30 ENCOUNTER — Other Ambulatory Visit: Payer: Self-pay | Admitting: *Deleted

## 2019-03-30 VITALS — BP 99/60 | HR 66 | Temp 97.9°F | Resp 18

## 2019-03-30 DIAGNOSIS — C787 Secondary malignant neoplasm of liver and intrahepatic bile duct: Secondary | ICD-10-CM | POA: Diagnosis not present

## 2019-03-30 DIAGNOSIS — Z5111 Encounter for antineoplastic chemotherapy: Secondary | ICD-10-CM | POA: Diagnosis not present

## 2019-03-30 DIAGNOSIS — C20 Malignant neoplasm of rectum: Secondary | ICD-10-CM

## 2019-03-30 DIAGNOSIS — R7989 Other specified abnormal findings of blood chemistry: Secondary | ICD-10-CM | POA: Diagnosis not present

## 2019-03-30 MED ORDER — SODIUM CHLORIDE 0.9% FLUSH
10.0000 mL | INTRAVENOUS | Status: DC | PRN
  Administered 2019-03-30: 10 mL
  Filled 2019-03-30: qty 10

## 2019-03-30 MED ORDER — HYDROCORTISONE (PERIANAL) 2.5 % EX CREA: TOPICAL_CREAM | CUTANEOUS | 0 refills | Status: AC

## 2019-03-30 MED ORDER — HEPARIN SOD (PORK) LOCK FLUSH 100 UNIT/ML IV SOLN
500.0000 [IU] | Freq: Once | INTRAVENOUS | Status: AC | PRN
  Administered 2019-03-30: 500 [IU]
  Filled 2019-03-30: qty 5

## 2019-03-31 ENCOUNTER — Encounter: Payer: Self-pay | Admitting: *Deleted

## 2019-04-04 ENCOUNTER — Ambulatory Visit
Admission: RE | Admit: 2019-04-04 | Discharge: 2019-04-04 | Disposition: A | Discharge status: Home / Self Care | Payer: 59 | Source: Ambulatory Visit | Attending: Family | Admitting: Family

## 2019-04-04 ENCOUNTER — Encounter: Payer: Self-pay | Admitting: *Deleted

## 2019-04-04 ENCOUNTER — Other Ambulatory Visit: Payer: Self-pay

## 2019-04-04 DIAGNOSIS — C20 Malignant neoplasm of rectum: Secondary | ICD-10-CM | POA: Diagnosis not present

## 2019-04-04 DIAGNOSIS — R911 Solitary pulmonary nodule: Secondary | ICD-10-CM | POA: Diagnosis not present

## 2019-04-04 MED ORDER — IOPAMIDOL (ISOVUE-300) INJECTION 61%
100.0000 mL | Freq: Once | INTRAVENOUS | Status: AC | PRN
  Administered 2019-04-04: 100 mL via INTRAVENOUS

## 2019-04-04 NOTE — Progress Notes (Signed)
Pharmacist Chemotherapy Monitoring - Follow Up Assessment    I verify that I have reviewed each item in the below checklist:  . Regimen for the patient is scheduled for the appropriate day and plan matches scheduled date. Marland Kitchen Appropriate non-routine labs are ordered dependent on drug ordered. . If applicable, additional medications reviewed and ordered per protocol based on lifetime cumulative doses and/or treatment regimen.   Plan for follow-up and/or issues identified: No . I-vent associated with next due treatment: No . MD and/or nursing notified: No  Sharon Harris, Sharon Harris 04/04/2019 12:49 PM

## 2019-04-04 NOTE — Progress Notes (Signed)
Called and gave patient following results  Can you let Ms. Ransford know that her cancer appears to be stable/modestly improving? There is no worsening disease. We will continue the current treatment.   Thanks.   [place]

## 2019-04-11 ENCOUNTER — Encounter: Payer: Self-pay | Admitting: *Deleted

## 2019-04-11 ENCOUNTER — Telehealth: Payer: Self-pay | Admitting: Hematology

## 2019-04-11 ENCOUNTER — Other Ambulatory Visit: Payer: Self-pay

## 2019-04-11 ENCOUNTER — Encounter: Payer: Self-pay | Admitting: Hematology

## 2019-04-11 ENCOUNTER — Inpatient Hospital Stay: Payer: 59

## 2019-04-11 ENCOUNTER — Inpatient Hospital Stay (HOSPITAL_BASED_OUTPATIENT_CLINIC_OR_DEPARTMENT_OTHER): Payer: 59 | Admitting: Hematology

## 2019-04-11 VITALS — BP 99/60 | HR 75 | Temp 97.1°F | Resp 18 | Ht 68.0 in | Wt 125.0 lb

## 2019-04-11 DIAGNOSIS — C20 Malignant neoplasm of rectum: Secondary | ICD-10-CM

## 2019-04-11 DIAGNOSIS — G620 Drug-induced polyneuropathy: Secondary | ICD-10-CM | POA: Diagnosis not present

## 2019-04-11 DIAGNOSIS — G893 Neoplasm related pain (acute) (chronic): Secondary | ICD-10-CM

## 2019-04-11 DIAGNOSIS — R7989 Other specified abnormal findings of blood chemistry: Secondary | ICD-10-CM | POA: Diagnosis not present

## 2019-04-11 DIAGNOSIS — T451X5A Adverse effect of antineoplastic and immunosuppressive drugs, initial encounter: Secondary | ICD-10-CM | POA: Diagnosis not present

## 2019-04-11 DIAGNOSIS — Z5111 Encounter for antineoplastic chemotherapy: Secondary | ICD-10-CM | POA: Diagnosis not present

## 2019-04-11 DIAGNOSIS — C787 Secondary malignant neoplasm of liver and intrahepatic bile duct: Secondary | ICD-10-CM | POA: Diagnosis not present

## 2019-04-11 DIAGNOSIS — D6959 Other secondary thrombocytopenia: Secondary | ICD-10-CM

## 2019-04-11 LAB — CBC WITH DIFFERENTIAL (CANCER CENTER ONLY)
Abs Immature Granulocytes: 0.02 10*3/uL (ref 0.00–0.07)
Basophils Absolute: 0.1 10*3/uL (ref 0.0–0.1)
Basophils Relative: 1 %
Eosinophils Absolute: 0.1 10*3/uL (ref 0.0–0.5)
Eosinophils Relative: 2 %
HCT: 37.4 % (ref 36.0–46.0)
Hemoglobin: 12.9 g/dL (ref 12.0–15.0)
Immature Granulocytes: 1 %
Lymphocytes Relative: 28 %
Lymphs Abs: 1.2 10*3/uL (ref 0.7–4.0)
MCH: 31.1 pg (ref 26.0–34.0)
MCHC: 34.5 g/dL (ref 30.0–36.0)
MCV: 90.1 fL (ref 80.0–100.0)
Monocytes Absolute: 0.6 10*3/uL (ref 0.1–1.0)
Monocytes Relative: 15 %
Neutro Abs: 2.2 10*3/uL (ref 1.7–7.7)
Neutrophils Relative %: 53 %
Platelet Count: 138 10*3/uL — ABNORMAL LOW (ref 150–400)
RBC: 4.15 MIL/uL (ref 3.87–5.11)
RDW: 14.8 % (ref 11.5–15.5)
WBC Count: 4.1 10*3/uL (ref 4.0–10.5)
nRBC: 0 % (ref 0.0–0.2)

## 2019-04-11 LAB — CMP (CANCER CENTER ONLY)
ALT: 28 U/L (ref 0–44)
AST: 29 U/L (ref 15–41)
Albumin: 4 g/dL (ref 3.5–5.0)
Alkaline Phosphatase: 155 U/L — ABNORMAL HIGH (ref 38–126)
Anion gap: 6 (ref 5–15)
BUN: 13 mg/dL (ref 6–20)
CO2: 30 mmol/L (ref 22–32)
Calcium: 9.5 mg/dL (ref 8.9–10.3)
Chloride: 103 mmol/L (ref 98–111)
Creatinine: 0.6 mg/dL (ref 0.44–1.00)
GFR, Est AFR Am: >60 mL/min (ref >60)
GFR, Estimated: >60 mL/min (ref >60)
Glucose, Bld: 99 mg/dL (ref 70–99)
Potassium: 4 mmol/L (ref 3.5–5.1)
Sodium: 139 mmol/L (ref 135–145)
Total Bilirubin: 0.8 mg/dL (ref 0.3–1.2)
Total Protein: 6.4 g/dL — ABNORMAL LOW (ref 6.5–8.1)

## 2019-04-11 MED ORDER — PALONOSETRON HCL INJECTION 0.25 MG/5ML
0.2500 mg | Freq: Once | INTRAVENOUS | Status: AC
  Administered 2019-04-11: 0.25 mg via INTRAVENOUS

## 2019-04-11 MED ORDER — OXALIPLATIN CHEMO INJECTION 100 MG/20ML
85.0000 mg/m2 | Freq: Once | INTRAVENOUS | Status: AC
  Administered 2019-04-11: 12:00:00 140 mg via INTRAVENOUS
  Filled 2019-04-11: qty 20

## 2019-04-11 MED ORDER — DEXAMETHASONE SODIUM PHOSPHATE 10 MG/ML IJ SOLN
INTRAMUSCULAR | Status: AC
  Filled 2019-04-11: qty 1

## 2019-04-11 MED ORDER — SODIUM CHLORIDE 0.9% FLUSH
10.0000 mL | INTRAVENOUS | Status: DC | PRN
  Filled 2019-04-11: qty 10

## 2019-04-11 MED ORDER — HEPARIN SOD (PORK) LOCK FLUSH 100 UNIT/ML IV SOLN
500.0000 [IU] | Freq: Once | INTRAVENOUS | Status: DC | PRN
  Filled 2019-04-11: qty 5

## 2019-04-11 MED ORDER — FLUOROURACIL CHEMO INJECTION 2.5 GM/50ML
400.0000 mg/m2 | Freq: Once | INTRAVENOUS | Status: AC
  Administered 2019-04-11: 14:00:00 650 mg via INTRAVENOUS
  Filled 2019-04-11: qty 13

## 2019-04-11 MED ORDER — SODIUM CHLORIDE 0.9 % IV SOLN
2400.0000 mg/m2 | INTRAVENOUS | Status: DC
  Administered 2019-04-11: 4000 mg via INTRAVENOUS
  Filled 2019-04-11: qty 80

## 2019-04-11 MED ORDER — DEXTROSE 5 % IV SOLN
Freq: Once | INTRAVENOUS | Status: AC
  Filled 2019-04-11: qty 250

## 2019-04-11 MED ORDER — PALONOSETRON HCL INJECTION 0.25 MG/5ML
INTRAVENOUS | Status: AC
  Filled 2019-04-11: qty 5

## 2019-04-11 MED ORDER — DEXAMETHASONE SODIUM PHOSPHATE 10 MG/ML IJ SOLN
10.0000 mg | Freq: Once | INTRAMUSCULAR | Status: AC
  Administered 2019-04-11: 10 mg via INTRAVENOUS

## 2019-04-11 MED ORDER — LEUCOVORIN CALCIUM INJECTION 350 MG
400.0000 mg/m2 | Freq: Once | INTRAVENOUS | Status: AC
  Administered 2019-04-11: 12:00:00 668 mg via INTRAVENOUS
  Filled 2019-04-11: qty 33.4

## 2019-04-11 NOTE — Patient Instructions (Signed)

## 2019-04-11 NOTE — Progress Notes (Signed)
[facility] OFFICE PROGRESS NOTE  Patient Care Team: Shirley, [place], DO as PCP - General (Family Medicine) Tish Men, MD as Medical Oncologist (Hematology) Cordelia Poche, RN as Oncology Nurse Navigator  HEME/ONC OVERVIEW: 1. Metastatic rectal adenocarcinoma to the liver and likely lungs; MMR proficient, KRAS mutated, HER2 amplified  -10/2018: multiple liver masses on CT abdomen, largest 4.6x4.2cm, suggestive of hemangiomas; no definite primary malignancy;  -01/2019:   A large rectal mass obstructing the proximal rectum, ~10cm from the anal verge, circumferential; bx'ed, adenocarcinoma  Numerous masses involving all segments of liver, largest ~8cm, as well as enlarging subcentimeter pulmonary nodules  Liver bx showed adenocarcinoma, c/w rectal primary; MSI stable, KRAS mutated, HER2 amplified  -02/2019 - present: 1st line FOLFOX   Improving disease in the pulmonary and liver mets; no new or worsening metastases   TREATMENT SUMMARY:  02/14/2019 - present: 1st line FOLFOX   ASSESSMENT & PLAN:   Metastatic rectal adenocarcinoma to the liver and likely lungs; MMR proficient, KRAS mutated, HER2 amplified  -S/p 4 cycles of FOLFOX in the 1st line setting; not a candidate for Vectibix due to KRAS mutation or Avastin due to near obstruction in the rectum  -I independently reviewed the radiologic images of recent CT chest, abdomen and pelvis, and agree with findings documented.  In summary, CT showed some reduction in the pulmonary and liver metastases, with overall stable intra-abdominal lymphadenopathy.  There was no evidence of new or worsening disease. -I reviewed imaging results in detail with patient.  Given the improving disease, we will continue the current treatment regimen.  -Labs adequate, proceed with Cycle 5 of FOLFOX -I also discussed the case with Dr. Reynaldo Minium at [facility].  If she progresses on 1st line FOLFOX, Dr. Reynaldo Minium recommended contacting [facility] regarding  clinical trials for Her2 amplified rectal malignancy.  -We will monitor her disease response after Cycle 8 of treatment  -PRN anti-emetics: Zofran, Compazine, dexamethasone   Chemotherapy-associated thrombocytopenia -Secondary to chemotherapy -Plts 138k, stable -Patient denies any symptoms of bleeding or excess bruising, such as epistaxis, hematochezia, melena, or hematuria -Continue treatment as outlined above   Cancer-related pain -Secondary to rectal adenocarcinoma and extensive liver metastases -Pain overall improving, only requiring PRN Norco -Continue the current regimen for now  -I counseled the patient on the importance of maintaining soft BM's   Chemotherapy-associated neuropathy -Secondary to chemotherapy -Symptoms are primarily related to cold sensitivity; no persistent neuropathic symptoms when not exposed to cold  -Continue treatment as outlined above -If she develops progressive neuropathy not related to cold in the future, we may need to reduce the treatment dose   Elevated LFT's  -Likely secondary to extensive liver masses -Alk phos 155, stable; AST, ALT and Tbili normal  -We will monitor it for now   No orders of the defined types were placed in this encounter.  All questions were answered. The patient knows to call the clinic with any problems, questions or concerns. No barriers to learning was detected.  Return in 2 weeks for Cycle 6 of treatment, and 4 weeks for chemotherapy and clinic appt.  Tish Men, MD 3/30/202110:28 AM  CHIEF COMPLAINT: "I am doing fine"  INTERVAL HISTORY: Sharon Harris returns clinic for follow-up of metastatic adenocarcinoma of the rectum.  Patient reports that she has been tolerating treatment well since her last visit, except mild tingling sensation in the bilateral hands, triggered by cold exposure.  She denies any persistent neuropathy.  She has occasional pain in the abdomen, which  she attributes to constipation, but she has not  required any pain medication for several weeks.  Her appetite is good.  She denies any other complaint today.  REVIEW OF SYSTEMS:   Constitutional: ( - ) fevers, ( - )  chills , ( - ) night sweats Eyes: ( - ) blurriness of vision, ( - ) double vision, ( - ) watery eyes Ears, nose, mouth, throat, and face: ( - ) mucositis, ( - ) sore throat Respiratory: ( - ) cough, ( - ) dyspnea, ( - ) wheezes Cardiovascular: ( - ) palpitation, ( - ) chest discomfort, ( - ) lower extremity swelling Gastrointestinal:  ( - ) nausea, ( - ) heartburn, ( - ) change in bowel habits Skin: ( - ) abnormal skin rashes Lymphatics: ( - ) new lymphadenopathy, ( - ) easy bruising Neurological: ( - ) numbness, ( + ) tingling, ( - ) new weaknesses Behavioral/Psych: ( - ) mood change, ( - ) new changes  All other systems were reviewed with the patient and are negative.  SUMMARY OF ONCOLOGIC HISTORY: Oncology History  Rectal adenocarcinoma ([place])  11/09/2018 Imaging   CT abdomen/pelvis w/ contrast: IMPRESSION: 1. Diffuse stool throughout the colon. No bowel obstruction. No abscess in the abdomen or pelvis. Appendix absent.   2. Multiple liver masses, likely multiple hemangiomas. Note that these lesions on current examination do not have classic hemangioma appearance. Given the size and multiplicity of these lesions, further assessment nonemergently would be advisable. In this regard, pre and serial post-contrast MR of the liver would be the optimum imaging study of choice to further evaluate. If there is a contraindication to MR, pre and serial post-contrast CT of the liver would be an alternative for further assessment.   3. 3 mm nodular opacity in the right middle lobe. No follow-up needed if patient is low-risk. Non-contrast chest CT can be considered in 12 months if patient is high-risk. This recommendation follows the consensus statement: Guidelines for Management of Incidental Pulmonary Nodules Detected on CT  Images: From the Fleischner Society 2017; Radiology 2017; 284:228-243.   4. No evident renal or ureteral calculus. No hydronephrosis. Urinary bladder wall thickness normal.   5.  Aortic Atherosclerosis (ICD10-I70.0).   01/20/2019 Procedure   Colonoscopy: - Hemorrhoids were found on perianal exam. - A frond-like/villous, fungating and ulcerated completely obstructing large mass was found in the proximal rectum, located approximately 10 cm from the anal verge. The mass was circumferential and non-traversable. Oozing was present. This was extensively biopsied with a cold forceps for histology. Path was sent as rush. Estimated blood loss was minimal. - Non-bleeding internal hemorrhoids were found during retroflexion. The hemorrhoids were large.   01/20/2019 Imaging   EGD: - The examined esophagus was normal. - The entire examined stomach was normal. Biopsies were taken with a cold forceps for Helicobacter pylori testing. Estimated blood loss was minimal. - The duodenal bulb, first portion of the duodenum and second portion of the duodenum were normal. Biopsies were taken with a cold forceps for histology. Estimated blood loss was minimal.   01/24/2019 Initial Diagnosis   Rectal adenocarcinoma ([facility])   01/25/2019 Imaging   MRI abdomen: IMPRESSION: 1. Marked worsening of what is now characterized as hepatic metastatic disease, in this patient who now is known to have rectal cancer.   02/01/2019 Imaging   CT chest: IMPRESSION: 1. Multiple bulky hypodense, rim enhancing masses of the liver, which are in keeping with findings of recent prior MRI  and greatly enlarged compared to prior CT dated 11/09/2018. The largest index mass of the central right lobe of the liver measures 9.6 x 9.1 cm, previously 4.2 x 3.9 cm (series 2, image 50). Findings are consistent with hepatic metastatic disease. 2. There is circumferential thickening of the rectum, which appears increased compared to prior  examination (series 2, image 25), in keeping with primary rectal malignancy. There is a soft tissue nodule adjacent to the superior rectum measuring 1.5 cm, enlarged compared to prior examination, previously 1.0 cm (series 2, image 97, series 5, image 101), consistent with perirectal nodal metastatic disease. 3. There is a 7 mm subpleural nodule of the anterior right middle lobe, significantly enlarged compared to prior examination, previously 3 mm (series 4, image 114). This is presumably pulmonary metastatic disease. 4. Biapical pleuroparenchymal scarring and multiple small nodules of the bilateral lung apices, the largest a 6 mm pulmonary nodule of the right apex (series 4, image 36). These findings are likely sequelae of prior infection or inflammation. Attention on follow-up. 5.  Aortic Atherosclerosis (ICD10-I70.0).   02/01/2019 Imaging   IMPRESSION: 1. Multiple bulky hypodense, rim enhancing masses of the liver, which are in keeping with findings of recent prior MRI and greatly enlarged compared to prior CT dated 11/09/2018. The largest index mass of the central right lobe of the liver measures 9.6 x 9.1 cm, previously 4.2 x 3.9 cm (series 2, image 50). Findings are consistent with hepatic metastatic disease. 2. There is circumferential thickening of the rectum, which appears increased compared to prior examination (series 2, image 25), in keeping with primary rectal malignancy. There is a soft tissue nodule adjacent to the superior rectum measuring 1.5 cm, enlarged compared to prior examination, previously 1.0 cm (series 2, image 97, series 5, image 101), consistent with perirectal nodal metastatic disease. 3. There is a 7 mm subpleural nodule of the anterior right middle lobe, significantly enlarged compared to prior examination, previously 3 mm (series 4, image 114). This is presumably pulmonary metastatic disease. 4. Biapical pleuroparenchymal scarring and multiple small  nodules of the bilateral lung apices, the largest a 6 mm pulmonary nodule of the right apex (series 4, image 36). These findings are likely sequelae of prior infection or inflammation. Attention on follow-up. 5.  Aortic Atherosclerosis (ICD10-I70.0).   02/07/2019 Pathology Results   FINAL MICROSCOPIC DIAGNOSIS:   A. LIVER, BIOPSY:  - Metastatic adenocarcinoma to the liver, consistent with patient's  clinical history of primary rectal adenocarcinoma.  See comment   02/09/2019 Cancer Staging   Staging form: Colon and Rectum, AJCC 8th Edition - Clinical: Stage Unknown (cTX, cNX, cM1) - Signed by Tish Men, MD on 02/09/2019   02/14/2019 -  Chemotherapy   The patient had palonosetron (ALOXI) injection 0.25 mg, 0.25 mg, Intravenous,  Once, 4 of 10 cycles Administration: 0.25 mg (02/14/2019), 0.25 mg (02/28/2019), 0.25 mg (03/14/2019), 0.25 mg (03/28/2019) leucovorin 668 mg in dextrose 5 % 250 mL infusion, 400 mg/m2 = 668 mg, Intravenous,  Once, 4 of 10 cycles Administration: 668 mg (02/14/2019), 668 mg (02/28/2019), 668 mg (03/14/2019), 668 mg (03/28/2019) oxaliplatin (ELOXATIN) 140 mg in dextrose 5 % 500 mL chemo infusion, 85 mg/m2 = 140 mg, Intravenous,  Once, 4 of 10 cycles Administration: 140 mg (02/14/2019), 140 mg (02/28/2019), 140 mg (03/14/2019), 140 mg (03/28/2019) fluorouracil (ADRUCIL) chemo injection 650 mg, 400 mg/m2 = 650 mg, Intravenous,  Once, 4 of 10 cycles Administration: 650 mg (02/14/2019), 650 mg (02/28/2019), 650 mg (03/14/2019), 650 mg (03/28/2019) fluorouracil (  ADRUCIL) 4,000 mg in sodium chloride 0.9 % 70 mL chemo infusion, 2,400 mg/m2 = 4,000 mg, Intravenous, 1 Day/Dose, 4 of 10 cycles Administration: 4,000 mg (02/14/2019), 4,000 mg (02/28/2019), 4,000 mg (03/14/2019), 4,000 mg (03/28/2019)  for chemotherapy treatment.    04/04/2019 Imaging   CT CAP: IMPRESSION: 1. Modest reduction in the size of the right middle lobe pulmonary nodule. Some of the liver masses are mildly reduced in volume compared  to previous, others are stable. 2. Similar appearance of the local adenopathy adjacent to the upper rectal/rectosigmoid mass. 3. Wall thickening in the sigmoid colon is probably mainly from nondistention although a component of inflammation is not excluded. 4.  Aortic Atherosclerosis (ICD10-I70.0). 5. Subtle presacral edema. 6. No change in the nodular biapical pleuroparenchymal scarring.     I have reviewed the past medical history, past surgical history, social history and family history with the patient and they are unchanged from previous note.  ALLERGIES:  has No Known Allergies.  MEDICATIONS:  Current Outpatient Medications  Medication Sig Dispense Refill  . AMBULATORY NON FORMULARY MEDICATION 2 tablets 2 (two) times daily. [place]    . dexamethasone (DECADRON) 4 MG tablet Take 2 tablets (8 mg total) by mouth daily. Start the day after chemotherapy for 2 days. Take with food. 30 tablet 1  . HYDROcodone-acetaminophen (NORCO) 5-325 MG tablet Take 1 tablet by mouth every 6 (six) hours as needed for moderate pain. 50 tablet 0  . hydrocortisone (PROCTOZONE-HC) 2.5 % rectal cream PLACE 1 APPLICATION RECTALLY 2 (TWO) TIMES DAILY. 30 g 0  . lidocaine-prilocaine (EMLA) cream Apply to affected area once 30 g 3  . LORazepam (ATIVAN) 0.5 MG tablet Take 1 tablet (0.5 mg total) by mouth every 6 (six) hours as needed (Nausea or vomiting). 30 tablet 0  . Lysine 500 MG TABS Take 1 tablet by mouth daily.    . Multiple Vitamin (MULTIVITAMIN WITH MINERALS) TABS tablet Take 1 tablet by mouth daily.    . ondansetron (ZOFRAN) 8 MG tablet Take 1 tablet (8 mg total) by mouth 2 (two) times daily as needed for refractory nausea / vomiting. Start on day 3 after chemotherapy. 30 tablet 1  . polyethylene glycol powder (GLYCOLAX/MIRALAX) 17 GM/SCOOP powder Take 17 g by mouth 2 (two) times daily as needed. 3350 g 1  . prochlorperazine (COMPAZINE) 10 MG tablet Take 1 tablet (10 mg total) by mouth every 6 (six) hours  as needed (Nausea or vomiting). 30 tablet 1  . senna-docusate (SENNA-S) 8.6-50 MG tablet Take 1 tablet by mouth daily.     No current facility-administered medications for this visit.    PHYSICAL EXAMINATION: ECOG PERFORMANCE STATUS: 0 - Asymptomatic  Today's Vitals   04/11/19 1007  BP: 99/60  Pulse: 75  Resp: 18  Temp: (!) 97.1 F (36.2 C)  TempSrc: Temporal  SpO2: 100%  Weight: 125 lb (56.7 kg)  Height: 5' 8"  (1.727 m)  PainSc: 0-No pain   Body mass index is 19.01 kg/m.  Filed Weights   04/11/19 1007  Weight: 125 lb (56.7 kg)    GENERAL: alert, no distress and comfortable SKIN: skin color, texture, turgor are normal, no rashes or significant lesions EYES: conjunctiva are pink and non-injected, sclera clear OROPHARYNX: no exudate, no erythema; lips, buccal mucosa, and tongue normal  NECK: supple, non-tender LUNGS: clear to auscultation with normal breathing effort HEART: regular rate & rhythm and no murmurs and no lower extremity edema ABDOMEN: soft, non-tender, non-distended, normal bowel sounds Musculoskeletal: no cyanosis  of digits and no clubbing  PSYCH: alert & oriented x 3, fluent speech  LABORATORY DATA:  I have reviewed the data as listed    Component Value Date/Time   NA 139 03/28/2019 0938   NA 141 11/02/2018 1654   K 3.7 03/28/2019 0938   CL 104 03/28/2019 0938   CO2 29 03/28/2019 0938   GLUCOSE 107 (H) 03/28/2019 0938   BUN 11 03/28/2019 0938   BUN 15 11/02/2018 1654   CREATININE 0.65 03/28/2019 0938   CALCIUM 9.2 03/28/2019 0938   PROT 6.1 (L) 03/28/2019 0938   PROT 6.7 11/02/2018 1654   ALBUMIN 4.1 03/28/2019 0938   ALBUMIN 4.7 11/02/2018 1654   AST 29 03/28/2019 0938   ALT 27 03/28/2019 0938   ALKPHOS 148 (H) 03/28/2019 0938   BILITOT 0.8 03/28/2019 0938   GFRNONAA >60 03/28/2019 0938   GFRAA >60 03/28/2019 0938    No results found for: SPEP, UPEP  Lab Results  Component Value Date   WBC 4.1 04/11/2019   NEUTROABS 2.2 04/11/2019    HGB 12.9 04/11/2019   HCT 37.4 04/11/2019   MCV 90.1 04/11/2019   PLT 138 (L) 04/11/2019      Chemistry      Component Value Date/Time   NA 139 03/28/2019 0938   NA 141 11/02/2018 1654   K 3.7 03/28/2019 0938   CL 104 03/28/2019 0938   CO2 29 03/28/2019 0938   BUN 11 03/28/2019 0938   BUN 15 11/02/2018 1654   CREATININE 0.65 03/28/2019 0938      Component Value Date/Time   CALCIUM 9.2 03/28/2019 0938   ALKPHOS 148 (H) 03/28/2019 0938   AST 29 03/28/2019 0938   ALT 27 03/28/2019 0938   BILITOT 0.8 03/28/2019 0938       RADIOGRAPHIC STUDIES: I have personally reviewed the radiological images as listed below and agreed with the findings in the report. CT Chest W Contrast  Result Date: 04/04/2019 CLINICAL DATA:  Restaging of metastatic rectal cancer. Currently on first-line FOLFOX. EXAM: CT CHEST, ABDOMEN, AND PELVIS WITH CONTRAST TECHNIQUE: Multidetector CT imaging of the chest, abdomen and pelvis was performed following the standard protocol during bolus administration of intravenous contrast. CONTRAST:  122m ISOVUE-300 IOPAMIDOL (ISOVUE-300) INJECTION 61% COMPARISON:  02/01/2019 FINDINGS: CT CHEST FINDINGS Cardiovascular: Right Port-A-Cath tip: Right atrium. Mediastinum/Nodes: Unremarkable Lungs/Pleura: Stable considerable biapical pleuroparenchymal scarring with nodular components, unchanged. Right middle lobe pulmonary nodule 0.6 by 0.5 cm on image 111/4, previously 0.8 by 0.6 cm by my measurements, mildly improved in size. Musculoskeletal: Unremarkable CT ABDOMEN PELVIS FINDINGS Hepatobiliary: Large masses are once again observed scattered in the liver, some with central necrosis. A mostly solid mass in the lateral segment left hepatic lobe measures 5.6 by 4.9 cm on image 55/2, previously 5.7 by 5.3 cm when measured in a similar fashion. A lesion in the caudate lobe measures 1.7 by 1.4 cm on image 63/2, previously the same. The solid mass posteriorly in the right hepatic lobe on  image 54/2 measures 6.9 by 4.5 cm, formerly 7.8 by 5.4 cm. Overall there is still a heavy metastatic burden to the liver but the individual lesions appear generally stable or mildly reduced. Pancreas: Unremarkable Spleen: Unremarkable Adrenals/Urinary Tract: Unremarkable Stomach/Bowel: Nondistention of potential wall thickening throughout the sigmoid colon, distal colitis is not excluded. There is some wall thickening in the rectum. The original tumor is thought to be in the upper rectum/rectosigmoid. Adjacent irregular nodal tissue including a 1.0 cm in short axis  lymph node on image 97/2, roughly stable from 02/01/2019. Vascular/Lymphatic: Aortoiliac atherosclerotic vascular disease. As noted above there is perirectal/presacral adenopathy adjacent to the primary tumor. Subtle presacral edema. Reproductive: Unremarkable Other: No supplemental non-categorized findings. Musculoskeletal: Mild lumbar degenerative disc disease. IMPRESSION: 1. Modest reduction in the size of the right middle lobe pulmonary nodule. Some of the liver masses are mildly reduced in volume compared to previous, others are stable. 2. Similar appearance of the local adenopathy adjacent to the upper rectal/rectosigmoid mass. 3. Wall thickening in the sigmoid colon is probably mainly from nondistention although a component of inflammation is not excluded. 4.  Aortic Atherosclerosis (ICD10-I70.0). 5. Subtle presacral edema. 6. No change in the nodular biapical pleuroparenchymal scarring. Electronically Signed   By: Van Clines M.D.   On: 04/04/2019 14:01   CT Abdomen Pelvis W Contrast  Result Date: 04/04/2019 CLINICAL DATA:  Restaging of metastatic rectal cancer. Currently on first-line FOLFOX. EXAM: CT CHEST, ABDOMEN, AND PELVIS WITH CONTRAST TECHNIQUE: Multidetector CT imaging of the chest, abdomen and pelvis was performed following the standard protocol during bolus administration of intravenous contrast. CONTRAST:  147m ISOVUE-300  IOPAMIDOL (ISOVUE-300) INJECTION 61% COMPARISON:  02/01/2019 FINDINGS: CT CHEST FINDINGS Cardiovascular: Right Port-A-Cath tip: Right atrium. Mediastinum/Nodes: Unremarkable Lungs/Pleura: Stable considerable biapical pleuroparenchymal scarring with nodular components, unchanged. Right middle lobe pulmonary nodule 0.6 by 0.5 cm on image 111/4, previously 0.8 by 0.6 cm by my measurements, mildly improved in size. Musculoskeletal: Unremarkable CT ABDOMEN PELVIS FINDINGS Hepatobiliary: Large masses are once again observed scattered in the liver, some with central necrosis. A mostly solid mass in the lateral segment left hepatic lobe measures 5.6 by 4.9 cm on image 55/2, previously 5.7 by 5.3 cm when measured in a similar fashion. A lesion in the caudate lobe measures 1.7 by 1.4 cm on image 63/2, previously the same. The solid mass posteriorly in the right hepatic lobe on image 54/2 measures 6.9 by 4.5 cm, formerly 7.8 by 5.4 cm. Overall there is still a heavy metastatic burden to the liver but the individual lesions appear generally stable or mildly reduced. Pancreas: Unremarkable Spleen: Unremarkable Adrenals/Urinary Tract: Unremarkable Stomach/Bowel: Nondistention of potential wall thickening throughout the sigmoid colon, distal colitis is not excluded. There is some wall thickening in the rectum. The original tumor is thought to be in the upper rectum/rectosigmoid. Adjacent irregular nodal tissue including a 1.0 cm in short axis lymph node on image 97/2, roughly stable from 02/01/2019. Vascular/Lymphatic: Aortoiliac atherosclerotic vascular disease. As noted above there is perirectal/presacral adenopathy adjacent to the primary tumor. Subtle presacral edema. Reproductive: Unremarkable Other: No supplemental non-categorized findings. Musculoskeletal: Mild lumbar degenerative disc disease. IMPRESSION: 1. Modest reduction in the size of the right middle lobe pulmonary nodule. Some of the liver masses are mildly reduced  in volume compared to previous, others are stable. 2. Similar appearance of the local adenopathy adjacent to the upper rectal/rectosigmoid mass. 3. Wall thickening in the sigmoid colon is probably mainly from nondistention although a component of inflammation is not excluded. 4.  Aortic Atherosclerosis (ICD10-I70.0). 5. Subtle presacral edema. 6. No change in the nodular biapical pleuroparenchymal scarring. Electronically Signed   By: WVan ClinesM.D.   On: 04/04/2019 14:01

## 2019-04-11 NOTE — Patient Instructions (Signed)
[facility] Discharge Instructions for Patients Receiving Chemotherapy  Today you received the following chemotherapy agents Oxaliplatin, 5FU, Leucovorin.  To help prevent nausea and vomiting after your treatment, we encourage you to take your nausea medication as indicated by your MD.   If you develop nausea and vomiting that is not controlled by your nausea medication, call the clinic.   BELOW ARE SYMPTOMS THAT SHOULD BE REPORTED IMMEDIATELY:  *FEVER GREATER THAN 100.5 F  *CHILLS WITH OR WITHOUT FEVER  NAUSEA AND VOMITING THAT IS NOT CONTROLLED WITH YOUR NAUSEA MEDICATION  *UNUSUAL SHORTNESS OF BREATH  *UNUSUAL BRUISING OR BLEEDING  TENDERNESS IN MOUTH AND THROAT WITH OR WITHOUT PRESENCE OF ULCERS  *URINARY PROBLEMS  *BOWEL PROBLEMS  UNUSUAL RASH Items with * indicate a potential emergency and should be followed up as soon as possible.  Feel free to call the clinic should you have any questions or concerns. The clinic phone number is [phone].  Please show the [place] at check-in to the Emergency Department and triage nurse.

## 2019-04-11 NOTE — Progress Notes (Signed)
Visited patient in infusion. She is doing well. She has no complaints. She has no need for any supplement samples at this time.   She has some questions about her 12 week leave through work, and transitioning this to a long term leave. I have sent a message to Otilio Carpen, to request that she reach out to the patient.

## 2019-04-11 NOTE — Telephone Encounter (Signed)
Appointments scheduled calendar declined due to patient having My Chart per 3/30 los

## 2019-04-13 ENCOUNTER — Inpatient Hospital Stay: Payer: 59 | Attending: Hematology

## 2019-04-13 ENCOUNTER — Other Ambulatory Visit: Payer: Self-pay

## 2019-04-13 VITALS — BP 115/65 | HR 70 | Temp 97.1°F | Resp 17

## 2019-04-13 DIAGNOSIS — C20 Malignant neoplasm of rectum: Secondary | ICD-10-CM | POA: Insufficient documentation

## 2019-04-13 DIAGNOSIS — Z452 Encounter for adjustment and management of vascular access device: Secondary | ICD-10-CM | POA: Insufficient documentation

## 2019-04-13 DIAGNOSIS — C787 Secondary malignant neoplasm of liver and intrahepatic bile duct: Secondary | ICD-10-CM | POA: Insufficient documentation

## 2019-04-13 DIAGNOSIS — Z5111 Encounter for antineoplastic chemotherapy: Secondary | ICD-10-CM | POA: Insufficient documentation

## 2019-04-13 MED ORDER — SODIUM CHLORIDE 0.9% FLUSH
10.0000 mL | INTRAVENOUS | Status: DC | PRN
  Administered 2019-04-13: 15:00:00 10 mL
  Filled 2019-04-13: qty 10

## 2019-04-13 MED ORDER — HEPARIN SOD (PORK) LOCK FLUSH 100 UNIT/ML IV SOLN
500.0000 [IU] | Freq: Once | INTRAVENOUS | Status: AC | PRN
  Administered 2019-04-13: 500 [IU]
  Filled 2019-04-13: qty 5

## 2019-04-14 DIAGNOSIS — C20 Malignant neoplasm of rectum: Secondary | ICD-10-CM | POA: Diagnosis not present

## 2019-04-18 NOTE — Progress Notes (Signed)
Pharmacist Chemotherapy Monitoring - Follow Up Assessment    I verify that I have reviewed each item in the below checklist:  . Regimen for the patient is scheduled for the appropriate day and plan matches scheduled date. Marland Kitchen Appropriate non-routine labs are ordered dependent on drug ordered. . If applicable, additional medications reviewed and ordered per protocol based on lifetime cumulative doses and/or treatment regimen.   Plan for follow-up and/or issues identified: No . I-vent associated with next due treatment: No . MD and/or nursing notified: No  Sharon Harris, Sharon Harris 04/18/2019 7:59 AM

## 2019-04-24 MED FILL — PROCTOZONE-HC 2.5 % CREA: 2.5 | 7 days supply | Qty: 30 | Fill #0

## 2019-04-25 ENCOUNTER — Encounter: Payer: Self-pay | Admitting: *Deleted

## 2019-04-25 ENCOUNTER — Inpatient Hospital Stay: Payer: 59

## 2019-04-25 ENCOUNTER — Other Ambulatory Visit: Payer: Self-pay | Admitting: Hematology

## 2019-04-25 ENCOUNTER — Other Ambulatory Visit: Payer: Self-pay

## 2019-04-25 DIAGNOSIS — C20 Malignant neoplasm of rectum: Secondary | ICD-10-CM

## 2019-04-25 DIAGNOSIS — Z5111 Encounter for antineoplastic chemotherapy: Secondary | ICD-10-CM | POA: Diagnosis not present

## 2019-04-25 DIAGNOSIS — Z452 Encounter for adjustment and management of vascular access device: Secondary | ICD-10-CM | POA: Diagnosis not present

## 2019-04-25 DIAGNOSIS — C787 Secondary malignant neoplasm of liver and intrahepatic bile duct: Secondary | ICD-10-CM | POA: Diagnosis not present

## 2019-04-25 LAB — CMP (CANCER CENTER ONLY)
ALT: 22 U/L (ref 0–44)
AST: 25 U/L (ref 15–41)
Albumin: 3.9 g/dL (ref 3.5–5.0)
Alkaline Phosphatase: 181 U/L — ABNORMAL HIGH (ref 38–126)
Anion gap: 7 (ref 5–15)
BUN: 5 mg/dL — ABNORMAL LOW (ref 6–20)
CO2: 28 mmol/L (ref 22–32)
Calcium: 9.3 mg/dL (ref 8.9–10.3)
Chloride: 104 mmol/L (ref 98–111)
Creatinine: 0.54 mg/dL (ref 0.44–1.00)
GFR, Est AFR Am: >60 mL/min (ref >60)
GFR, Estimated: >60 mL/min (ref >60)
Glucose, Bld: 92 mg/dL (ref 70–99)
Potassium: 3.7 mmol/L (ref 3.5–5.1)
Sodium: 139 mmol/L (ref 135–145)
Total Bilirubin: 0.6 mg/dL (ref 0.3–1.2)
Total Protein: 6.1 g/dL — ABNORMAL LOW (ref 6.5–8.1)

## 2019-04-25 LAB — CBC WITH DIFFERENTIAL (CANCER CENTER ONLY)
Abs Immature Granulocytes: 0.03 10*3/uL (ref 0.00–0.07)
Basophils Absolute: 0 10*3/uL (ref 0.0–0.1)
Basophils Relative: 1 %
Eosinophils Absolute: 0.1 10*3/uL (ref 0.0–0.5)
Eosinophils Relative: 2 %
HCT: 35.9 % — ABNORMAL LOW (ref 36.0–46.0)
Hemoglobin: 12.3 g/dL (ref 12.0–15.0)
Immature Granulocytes: 1 %
Lymphocytes Relative: 24 %
Lymphs Abs: 1 10*3/uL (ref 0.7–4.0)
MCH: 31.1 pg (ref 26.0–34.0)
MCHC: 34.3 g/dL (ref 30.0–36.0)
MCV: 90.9 fL (ref 80.0–100.0)
Monocytes Absolute: 0.6 10*3/uL (ref 0.1–1.0)
Monocytes Relative: 15 %
Neutro Abs: 2.5 10*3/uL (ref 1.7–7.7)
Neutrophils Relative %: 57 %
Platelet Count: 126 10*3/uL — ABNORMAL LOW (ref 150–400)
RBC: 3.95 MIL/uL (ref 3.87–5.11)
RDW: 15.1 % (ref 11.5–15.5)
WBC Count: 4.3 10*3/uL (ref 4.0–10.5)
nRBC: 0 % (ref 0.0–0.2)

## 2019-04-25 MED ORDER — SODIUM CHLORIDE 0.9 % IV SOLN
10.0000 mg | Freq: Once | INTRAVENOUS | Status: AC
  Administered 2019-04-25: 10 mg via INTRAVENOUS
  Filled 2019-04-25: qty 10

## 2019-04-25 MED ORDER — OXALIPLATIN CHEMO INJECTION 100 MG/20ML
85.0000 mg/m2 | Freq: Once | INTRAVENOUS | Status: AC
  Administered 2019-04-25: 11:00:00 140 mg via INTRAVENOUS
  Filled 2019-04-25: qty 20

## 2019-04-25 MED ORDER — DEXTROSE 5 % IV SOLN
Freq: Once | INTRAVENOUS | Status: AC
  Filled 2019-04-25: qty 250

## 2019-04-25 MED ORDER — PALONOSETRON HCL INJECTION 0.25 MG/5ML
INTRAVENOUS | Status: AC
  Filled 2019-04-25: qty 5

## 2019-04-25 MED ORDER — FLUOROURACIL CHEMO INJECTION 2.5 GM/50ML
400.0000 mg/m2 | Freq: Once | INTRAVENOUS | Status: AC
  Administered 2019-04-25: 650 mg via INTRAVENOUS
  Filled 2019-04-25: qty 13

## 2019-04-25 MED ORDER — SODIUM CHLORIDE 0.9 % IV SOLN
2400.0000 mg/m2 | INTRAVENOUS | Status: DC
  Administered 2019-04-25: 14:00:00 4000 mg via INTRAVENOUS
  Filled 2019-04-25: qty 80

## 2019-04-25 MED ORDER — LEUCOVORIN CALCIUM INJECTION 350 MG
400.0000 mg/m2 | Freq: Once | INTRAVENOUS | Status: AC
  Administered 2019-04-25: 11:00:00 668 mg via INTRAVENOUS
  Filled 2019-04-25: qty 33.4

## 2019-04-25 MED ORDER — PALONOSETRON HCL INJECTION 0.25 MG/5ML
0.2500 mg | Freq: Once | INTRAVENOUS | Status: AC
  Administered 2019-04-25: 10:00:00 0.25 mg via INTRAVENOUS

## 2019-04-25 NOTE — Patient Instructions (Signed)
Implanted Port Insertion, Care After °This sheet gives you information about how to care for yourself after your procedure. Your health care provider may also give you more specific instructions. If you have problems or questions, contact your health care provider. °What can I expect after the procedure? °After the procedure, it is common to have: °· Discomfort at the port insertion site. °· Bruising on the skin over the port. This should improve over 3-4 days. °Follow these instructions at home: °Port care °· After your port is placed, you will get a manufacturer's information card. The card has information about your port. Keep this card with you at all times. °· Take care of the port as told by your health care provider. Ask your health care provider if you or a family member can get training for taking care of the port at home. A home health care nurse may also take care of the port. °· Make sure to remember what type of port you have. °Incision care ° °  ° °· Follow instructions from your health care provider about how to take care of your port insertion site. Make sure you: °? Wash your hands with soap and water before and after you change your bandage (dressing). If soap and water are not available, use hand sanitizer. °? Change your dressing as told by your health care provider. °? Leave stitches (sutures), skin glue, or adhesive strips in place. These skin closures may need to stay in place for 2 weeks or longer. If adhesive strip edges start to loosen and curl up, you may trim the loose edges. Do not remove adhesive strips completely unless your health care provider tells you to do that. °· Check your port insertion site every day for signs of infection. Check for: °? Redness, swelling, or pain. °? Fluid or blood. °? Warmth. °? Pus or a bad smell. °Activity °· Return to your normal activities as told by your health care provider. Ask your health care provider what activities are safe for you. °· Do not  lift anything that is heavier than 10 lb (4.5 kg), or the limit that you are told, until your health care provider says that it is safe. °General instructions °· Take over-the-counter and prescription medicines only as told by your health care provider. °· Do not take baths, swim, or use a hot tub until your health care provider approves. Ask your health care provider if you may take showers. You may only be allowed to take sponge baths. °· Do not drive for 24 hours if you were given a sedative during your procedure. °· Wear a medical alert bracelet in case of an emergency. This will tell any health care providers that you have a port. °· Keep all follow-up visits as told by your health care provider. This is important. °Contact a health care provider if: °· You cannot flush your port with saline as directed, or you cannot draw blood from the port. °· You have a fever or chills. °· You have redness, swelling, or pain around your port insertion site. °· You have fluid or blood coming from your port insertion site. °· Your port insertion site feels warm to the touch. °· You have pus or a bad smell coming from the port insertion site. °Get help right away if: °· You have chest pain or shortness of breath. °· You have bleeding from your port that you cannot control. °Summary °· Take care of the port as told by your health   care provider. Keep the manufacturer's information card with you at all times. °· Change your dressing as told by your health care provider. °· Contact a health care provider if you have a fever or chills or if you have redness, swelling, or pain around your port insertion site. °· Keep all follow-up visits as told by your health care provider. °This information is not intended to replace advice given to you by your health care provider. Make sure you discuss any questions you have with your health care provider. °Document Revised: 07/27/2017 Document Reviewed: 07/27/2017 °Elsevier Patient Education ©  2020 [employer] ° °

## 2019-04-25 NOTE — Progress Notes (Signed)
Patient is seeking a second opinion and needs her medical records sent. She was given a release of information form and after she completed, form was given to Otilio Carpen, medical records.

## 2019-04-25 NOTE — Patient Instructions (Signed)
[facility] Discharge Instructions for Patients Receiving Chemotherapy  Today you received the following chemotherapy agents Oxaliplatin, 5FU, Leucovorin.  To help prevent nausea and vomiting after your treatment, we encourage you to take your nausea medication as indicated by your MD.   If you develop nausea and vomiting that is not controlled by your nausea medication, call the clinic.   BELOW ARE SYMPTOMS THAT SHOULD BE REPORTED IMMEDIATELY:  *FEVER GREATER THAN 100.5 F  *CHILLS WITH OR WITHOUT FEVER  NAUSEA AND VOMITING THAT IS NOT CONTROLLED WITH YOUR NAUSEA MEDICATION  *UNUSUAL SHORTNESS OF BREATH  *UNUSUAL BRUISING OR BLEEDING  TENDERNESS IN MOUTH AND THROAT WITH OR WITHOUT PRESENCE OF ULCERS  *URINARY PROBLEMS  *BOWEL PROBLEMS  UNUSUAL RASH Items with * indicate a potential emergency and should be followed up as soon as possible.  Feel free to call the clinic should you have any questions or concerns. The clinic phone number is [phone].  Please show the [facility] at check-in to the Emergency Department and triage nurse.

## 2019-04-26 ENCOUNTER — Telehealth: Payer: Self-pay | Admitting: Hematology

## 2019-04-26 NOTE — Telephone Encounter (Signed)
Pt  request & authorization form signed and medical records fax to:  [facility]: Marguarite Arbour: [id number]   Phone: [phone]      COPY SCANNED

## 2019-04-27 ENCOUNTER — Other Ambulatory Visit: Payer: Self-pay

## 2019-04-27 ENCOUNTER — Inpatient Hospital Stay: Payer: 59

## 2019-04-27 ENCOUNTER — Telehealth: Payer: Self-pay | Admitting: Hematology

## 2019-04-27 VITALS — BP 105/67 | HR 71 | Temp 97.1°F | Resp 17

## 2019-04-27 DIAGNOSIS — C20 Malignant neoplasm of rectum: Secondary | ICD-10-CM | POA: Diagnosis not present

## 2019-04-27 DIAGNOSIS — Z5111 Encounter for antineoplastic chemotherapy: Secondary | ICD-10-CM | POA: Diagnosis not present

## 2019-04-27 DIAGNOSIS — C787 Secondary malignant neoplasm of liver and intrahepatic bile duct: Secondary | ICD-10-CM | POA: Diagnosis not present

## 2019-04-27 DIAGNOSIS — Z452 Encounter for adjustment and management of vascular access device: Secondary | ICD-10-CM | POA: Diagnosis not present

## 2019-04-27 MED ORDER — HEPARIN SOD (PORK) LOCK FLUSH 100 UNIT/ML IV SOLN
500.0000 [IU] | Freq: Once | INTRAVENOUS | Status: AC | PRN
  Administered 2019-04-27: 500 [IU]
  Filled 2019-04-27: qty 5

## 2019-04-27 MED ORDER — SODIUM CHLORIDE 0.9% FLUSH
10.0000 mL | INTRAVENOUS | Status: DC | PRN
  Administered 2019-04-27: 13:00:00 10 mL
  Filled 2019-04-27: qty 10

## 2019-04-27 NOTE — Telephone Encounter (Signed)
UPDATE:   Pt  request & authorization form signed and medical records fax to:  [facility]: Marguarite Arbour: [id number]     Phone: [phone]           COPY SCANNED

## 2019-04-27 NOTE — Patient Instructions (Signed)
Fluorouracil, 5-FU injection What is this medicine? FLUOROURACIL, 5-FU (flure oh YOOR a sil) is a chemotherapy drug. It slows the growth of cancer cells. This medicine is used to treat many types of cancer like breast cancer, colon or rectal cancer, pancreatic cancer, and stomach cancer. This medicine may be used for other purposes; ask your health care provider or pharmacist if you have questions. COMMON BRAND NAME(S): Adrucil What should I tell my health care provider before I take this medicine? They need to know if you have any of these conditions:  blood disorders  dihydropyrimidine dehydrogenase (DPD) deficiency  infection (especially a virus infection such as chickenpox, cold sores, or herpes)  kidney disease  liver disease  malnourished, poor nutrition  recent or ongoing radiation therapy  an unusual or allergic reaction to fluorouracil, other chemotherapy, other medicines, foods, dyes, or preservatives  pregnant or trying to get pregnant  breast-feeding How should I use this medicine? This drug is given as an infusion or injection into a vein. It is administered in a hospital or clinic by a specially trained health care professional. Talk to your pediatrician regarding the use of this medicine in children. Special care may be needed. Overdosage: If you think you have taken too much of this medicine contact a poison control center or emergency room at once. NOTE: This medicine is only for you. Do not share this medicine with others. What if I miss a dose? It is important not to miss your dose. Call your doctor or health care professional if you are unable to keep an appointment. What may interact with this medicine?  allopurinol  cimetidine  dapsone  digoxin  hydroxyurea  leucovorin  levamisole  medicines for seizures like ethotoin, fosphenytoin, phenytoin  medicines to increase blood counts like filgrastim, pegfilgrastim, sargramostim  medicines that  treat or prevent blood clots like warfarin, enoxaparin, and dalteparin  methotrexate  metronidazole  pyrimethamine  some other chemotherapy drugs like busulfan, cisplatin, estramustine, vinblastine  trimethoprim  trimetrexate  vaccines Talk to your doctor or health care professional before taking any of these medicines:  acetaminophen  aspirin  ibuprofen  ketoprofen  naproxen This list may not describe all possible interactions. Give your health care provider a list of all the medicines, herbs, non-prescription drugs, or dietary supplements you use. Also tell them if you smoke, drink alcohol, or use illegal drugs. Some items may interact with your medicine. What should I watch for while using this medicine? Visit your doctor for checks on your progress. This drug may make you feel generally unwell. This is not uncommon, as chemotherapy can affect healthy cells as well as cancer cells. Report any side effects. Continue your course of treatment even though you feel ill unless your doctor tells you to stop. In some cases, you may be given additional medicines to help with side effects. Follow all directions for their use. Call your doctor or health care professional for advice if you get a fever, chills or sore throat, or other symptoms of a cold or flu. Do not treat yourself. This drug decreases your body's ability to fight infections. Try to avoid being around people who are sick. This medicine may increase your risk to bruise or bleed. Call your doctor or health care professional if you notice any unusual bleeding. Be careful brushing and flossing your teeth or using a toothpick because you may get an infection or bleed more easily. If you have any dental work done, tell your dentist you are   receiving this medicine. Avoid taking products that contain aspirin, acetaminophen, ibuprofen, naproxen, or ketoprofen unless instructed by your doctor. These medicines may hide a fever. Do not  become pregnant while taking this medicine. Women should inform their doctor if they wish to become pregnant or think they might be pregnant. There is a potential for serious side effects to an unborn child. Talk to your health care professional or pharmacist for more information. Do not breast-feed an infant while taking this medicine. Men should inform their doctor if they wish to father a child. This medicine may lower sperm counts. Do not treat diarrhea with over the counter products. Contact your doctor if you have diarrhea that lasts more than 2 days or if it is severe and watery. This medicine can make you more sensitive to the sun. Keep out of the sun. If you cannot avoid being in the sun, wear protective clothing and use sunscreen. Do not use sun lamps or tanning beds/booths. What side effects may I notice from receiving this medicine? Side effects that you should report to your doctor or health care professional as soon as possible:  allergic reactions like skin rash, itching or hives, swelling of the face, lips, or tongue  low blood counts - this medicine may decrease the number of white blood cells, red blood cells and platelets. You may be at increased risk for infections and bleeding.  signs of infection - fever or chills, cough, sore throat, pain or difficulty passing urine  signs of decreased platelets or bleeding - bruising, pinpoint red spots on the skin, black, tarry stools, blood in the urine  signs of decreased red blood cells - unusually weak or tired, fainting spells, lightheadedness  breathing problems  changes in vision  chest pain  mouth sores  nausea and vomiting  pain, swelling, redness at site where injected  pain, tingling, numbness in the hands or feet  redness, swelling, or sores on hands or feet  stomach pain  unusual bleeding Side effects that usually do not require medical attention (report to your doctor or health care professional if they  continue or are bothersome):  changes in finger or toe nails  diarrhea  dry or itchy skin  hair loss  headache  loss of appetite  sensitivity of eyes to the light  stomach upset  unusually teary eyes This list may not describe all possible side effects. Call your doctor for medical advice about side effects. You may report side effects to FDA at 1-800-FDA-1088. Where should I keep my medicine? This drug is given in a hospital or clinic and will not be stored at home. NOTE: This sheet is a summary. It may not cover all possible information. If you have questions about this medicine, talk to your doctor, pharmacist, or health care provider.  2020 [employer]vier/Gold Standard (2007-05-04 13:53:16)  

## 2019-05-02 NOTE — Progress Notes (Signed)
Pharmacist Chemotherapy Monitoring - Follow Up Assessment    I verify that I have reviewed each item in the below checklist:  . Regimen for the patient is scheduled for the appropriate day and plan matches scheduled date. Marland Kitchen Appropriate non-routine labs are ordered dependent on drug ordered. . If applicable, additional medications reviewed and ordered per protocol based on lifetime cumulative doses and/or treatment regimen.   Plan for follow-up and/or issues identified: No . I-vent associated with next due treatment: No . MD and/or nursing notified: No  Cythnia Osmun, [name] 05/02/2019 8:23 AM

## 2019-05-09 ENCOUNTER — Inpatient Hospital Stay (HOSPITAL_BASED_OUTPATIENT_CLINIC_OR_DEPARTMENT_OTHER): Payer: 59 | Admitting: Hematology

## 2019-05-09 ENCOUNTER — Other Ambulatory Visit: Payer: Self-pay

## 2019-05-09 ENCOUNTER — Inpatient Hospital Stay: Payer: 59

## 2019-05-09 ENCOUNTER — Telehealth: Payer: Self-pay | Admitting: Hematology

## 2019-05-09 ENCOUNTER — Encounter: Payer: Self-pay | Admitting: Hematology

## 2019-05-09 VITALS — BP 102/67 | HR 78 | Temp 97.1°F | Resp 18 | Ht 68.0 in | Wt 130.0 lb

## 2019-05-09 DIAGNOSIS — T451X5A Adverse effect of antineoplastic and immunosuppressive drugs, initial encounter: Secondary | ICD-10-CM

## 2019-05-09 DIAGNOSIS — C20 Malignant neoplasm of rectum: Secondary | ICD-10-CM

## 2019-05-09 DIAGNOSIS — D701 Agranulocytosis secondary to cancer chemotherapy: Secondary | ICD-10-CM | POA: Diagnosis not present

## 2019-05-09 DIAGNOSIS — Z5111 Encounter for antineoplastic chemotherapy: Secondary | ICD-10-CM | POA: Diagnosis not present

## 2019-05-09 DIAGNOSIS — Z452 Encounter for adjustment and management of vascular access device: Secondary | ICD-10-CM | POA: Diagnosis not present

## 2019-05-09 DIAGNOSIS — D6481 Anemia due to antineoplastic chemotherapy: Secondary | ICD-10-CM | POA: Diagnosis not present

## 2019-05-09 DIAGNOSIS — D6959 Other secondary thrombocytopenia: Secondary | ICD-10-CM

## 2019-05-09 DIAGNOSIS — G620 Drug-induced polyneuropathy: Secondary | ICD-10-CM

## 2019-05-09 DIAGNOSIS — C787 Secondary malignant neoplasm of liver and intrahepatic bile duct: Secondary | ICD-10-CM | POA: Diagnosis not present

## 2019-05-09 LAB — CBC WITH DIFFERENTIAL (CANCER CENTER ONLY)
Abs Immature Granulocytes: 0.01 10*3/uL (ref 0.00–0.07)
Basophils Absolute: 0 10*3/uL (ref 0.0–0.1)
Basophils Relative: 1 %
Eosinophils Absolute: 0.1 10*3/uL (ref 0.0–0.5)
Eosinophils Relative: 3 %
HCT: 34.7 % — ABNORMAL LOW (ref 36.0–46.0)
Hemoglobin: 11.9 g/dL — ABNORMAL LOW (ref 12.0–15.0)
Immature Granulocytes: 0 %
Lymphocytes Relative: 31 %
Lymphs Abs: 1.1 10*3/uL (ref 0.7–4.0)
MCH: 31.5 pg (ref 26.0–34.0)
MCHC: 34.3 g/dL (ref 30.0–36.0)
MCV: 91.8 fL (ref 80.0–100.0)
Monocytes Absolute: 0.6 10*3/uL (ref 0.1–1.0)
Monocytes Relative: 18 %
Neutro Abs: 1.6 10*3/uL — ABNORMAL LOW (ref 1.7–7.7)
Neutrophils Relative %: 47 %
Platelet Count: 121 10*3/uL — ABNORMAL LOW (ref 150–400)
RBC: 3.78 MIL/uL — ABNORMAL LOW (ref 3.87–5.11)
RDW: 15.4 % (ref 11.5–15.5)
WBC Count: 3.4 10*3/uL — ABNORMAL LOW (ref 4.0–10.5)
nRBC: 0 % (ref 0.0–0.2)

## 2019-05-09 LAB — CMP (CANCER CENTER ONLY)
ALT: 16 U/L (ref 0–44)
AST: 22 U/L (ref 15–41)
Albumin: 3.9 g/dL (ref 3.5–5.0)
Alkaline Phosphatase: 165 U/L — ABNORMAL HIGH (ref 38–126)
Anion gap: 6 (ref 5–15)
BUN: 10 mg/dL (ref 6–20)
CO2: 29 mmol/L (ref 22–32)
Calcium: 9.5 mg/dL (ref 8.9–10.3)
Chloride: 104 mmol/L (ref 98–111)
Creatinine: 0.58 mg/dL (ref 0.44–1.00)
GFR, Est AFR Am: >60 mL/min (ref >60)
GFR, Estimated: >60 mL/min (ref >60)
Glucose, Bld: 107 mg/dL — ABNORMAL HIGH (ref 70–99)
Potassium: 3.7 mmol/L (ref 3.5–5.1)
Sodium: 139 mmol/L (ref 135–145)
Total Bilirubin: 0.7 mg/dL (ref 0.3–1.2)
Total Protein: 6.4 g/dL — ABNORMAL LOW (ref 6.5–8.1)

## 2019-05-09 MED ORDER — FLUOROURACIL CHEMO INJECTION 2.5 GM/50ML
400.0000 mg/m2 | Freq: Once | INTRAVENOUS | Status: AC
  Administered 2019-05-09: 13:00:00 650 mg via INTRAVENOUS
  Filled 2019-05-09: qty 13

## 2019-05-09 MED ORDER — PALONOSETRON HCL INJECTION 0.25 MG/5ML
INTRAVENOUS | Status: AC
  Filled 2019-05-09: qty 5

## 2019-05-09 MED ORDER — SODIUM CHLORIDE 0.9 % IV SOLN
10.0000 mg | Freq: Once | INTRAVENOUS | Status: AC
  Administered 2019-05-09: 10 mg via INTRAVENOUS
  Filled 2019-05-09: qty 1

## 2019-05-09 MED ORDER — SODIUM CHLORIDE 0.9% FLUSH
10.0000 mL | INTRAVENOUS | Status: DC | PRN
  Filled 2019-05-09: qty 10

## 2019-05-09 MED ORDER — PALONOSETRON HCL INJECTION 0.25 MG/5ML
0.2500 mg | Freq: Once | INTRAVENOUS | Status: AC
  Administered 2019-05-09: 0.25 mg via INTRAVENOUS

## 2019-05-09 MED ORDER — DEXTROSE 5 % IV SOLN
Freq: Once | INTRAVENOUS | Status: AC
  Filled 2019-05-09: qty 250

## 2019-05-09 MED ORDER — SODIUM CHLORIDE 0.9 % IV SOLN
2400.0000 mg/m2 | INTRAVENOUS | Status: DC
  Administered 2019-05-09: 4000 mg via INTRAVENOUS
  Filled 2019-05-09: qty 80

## 2019-05-09 MED ORDER — OXALIPLATIN CHEMO INJECTION 100 MG/20ML
85.0000 mg/m2 | Freq: Once | INTRAVENOUS | Status: AC
  Administered 2019-05-09: 140 mg via INTRAVENOUS
  Filled 2019-05-09: qty 20

## 2019-05-09 MED ORDER — LEUCOVORIN CALCIUM INJECTION 350 MG
400.0000 mg/m2 | Freq: Once | INTRAVENOUS | Status: AC
  Administered 2019-05-09: 11:00:00 668 mg via INTRAVENOUS
  Filled 2019-05-09: qty 33.4

## 2019-05-09 MED ORDER — HEPARIN SOD (PORK) LOCK FLUSH 100 UNIT/ML IV SOLN
500.0000 [IU] | Freq: Once | INTRAVENOUS | Status: DC | PRN
  Filled 2019-05-09: qty 5

## 2019-05-09 NOTE — Patient Instructions (Signed)
Implanted Port Insertion, Care After °This sheet gives you information about how to care for yourself after your procedure. Your health care provider may also give you more specific instructions. If you have problems or questions, contact your health care provider. °What can I expect after the procedure? °After the procedure, it is common to have: °· Discomfort at the port insertion site. °· Bruising on the skin over the port. This should improve over 3-4 days. °Follow these instructions at home: °Port care °· After your port is placed, you will get a manufacturer's information card. The card has information about your port. Keep this card with you at all times. °· Take care of the port as told by your health care provider. Ask your health care provider if you or a family member can get training for taking care of the port at home. A home health care nurse may also take care of the port. °· Make sure to remember what type of port you have. °Incision care ° °  ° °· Follow instructions from your health care provider about how to take care of your port insertion site. Make sure you: °? Wash your hands with soap and water before and after you change your bandage (dressing). If soap and water are not available, use hand sanitizer. °? Change your dressing as told by your health care provider. °? Leave stitches (sutures), skin glue, or adhesive strips in place. These skin closures may need to stay in place for 2 weeks or longer. If adhesive strip edges start to loosen and curl up, you may trim the loose edges. Do not remove adhesive strips completely unless your health care provider tells you to do that. °· Check your port insertion site every day for signs of infection. Check for: °? Redness, swelling, or pain. °? Fluid or blood. °? Warmth. °? Pus or a bad smell. °Activity °· Return to your normal activities as told by your health care provider. Ask your health care provider what activities are safe for you. °· Do not  lift anything that is heavier than 10 lb (4.5 kg), or the limit that you are told, until your health care provider says that it is safe. °General instructions °· Take over-the-counter and prescription medicines only as told by your health care provider. °· Do not take baths, swim, or use a hot tub until your health care provider approves. Ask your health care provider if you may take showers. You may only be allowed to take sponge baths. °· Do not drive for 24 hours if you were given a sedative during your procedure. °· Wear a medical alert bracelet in case of an emergency. This will tell any health care providers that you have a port. °· Keep all follow-up visits as told by your health care provider. This is important. °Contact a health care provider if: °· You cannot flush your port with saline as directed, or you cannot draw blood from the port. °· You have a fever or chills. °· You have redness, swelling, or pain around your port insertion site. °· You have fluid or blood coming from your port insertion site. °· Your port insertion site feels warm to the touch. °· You have pus or a bad smell coming from the port insertion site. °Get help right away if: °· You have chest pain or shortness of breath. °· You have bleeding from your port that you cannot control. °Summary °· Take care of the port as told by your health   care provider. Keep the manufacturer's information card with you at all times. °· Change your dressing as told by your health care provider. °· Contact a health care provider if you have a fever or chills or if you have redness, swelling, or pain around your port insertion site. °· Keep all follow-up visits as told by your health care provider. °This information is not intended to replace advice given to you by your health care provider. Make sure you discuss any questions you have with your health care provider. °Document Revised: 07/27/2017 Document Reviewed: 07/27/2017 °Elsevier Patient Education ©  2020 [employer] ° °

## 2019-05-09 NOTE — Progress Notes (Addendum)
[facility] OFFICE PROGRESS NOTE  Patient Care Team: Shirley, [place], DO as PCP - General (Family Medicine) Tish Men, MD as Medical Oncologist (Hematology) Cordelia Poche, RN as Oncology Nurse Navigator  HEME/ONC OVERVIEW: 1. Metastatic rectal adenocarcinoma to the liver and likely lungs; MMR proficient, KRAS mutated, HER2 amplified  -10/2018: multiple liver masses on CT abdomen, largest 4.6x4.2cm, suggestive of hemangiomas; no definite primary malignancy;  -01/2019:   A large rectal mass obstructing the proximal rectum, ~10cm from the anal verge, circumferential; bx'ed, adenocarcinoma  Numerous masses involving all segments of liver, largest ~8cm, as well as enlarging subcentimeter pulmonary nodules  Liver bx showed adenocarcinoma, c/w rectal primary; MSI stable, KRAS mutated, HER2 amplified  -02/2019 - present: 1st line FOLFOX; no Avastin due to high grade obstruction  (After 4 cycles) Improving disease in the pulmonary and liver mets; no new or worsening metastases   TREATMENT SUMMARY:  02/14/2019 - present: 1st line FOLFOX   ASSESSMENT & PLAN:   Metastatic rectal adenocarcinoma to the liver and likely lungs; MMR proficient, KRAS mutated, HER2 amplified  -S/p 6 cycles of FOLFOX in the 1st line setting; not a candidate for Vectibix due to KRAS mutation or Avastin due to near obstruction in the rectum  -Patient is tolerating treatment well so far  -I reviewed imaging results in detail with patient.  Given the improving disease, we will continue the current treatment regimen.  -Labs reviewed and adequate, proceed with Cycle 7 of FOLFOX -Patient was from [place] and recently had established care with Dr. Duard Brady at [facility].  I had a lengthy discussion with Dr. Duard Brady.  He recommended stopping oxaliplatin after approximately 3 months of therapy (ie ~8 cycles) due to the increased risk of neurotoxicity.  Furthermore, as her malignancy appears to be responding to treatment,  the risk of tumor perforation or bleeding is relatively low, and Dr. Duard Brady recommended Avastin once oxaliplatin is discontinued.  -I reviewed the plan with the patient, who was in agreement with the plan  -PRN anti-emetics: Zofran, Compazine, dexamethasone   Chemotherapy-associated thrombocytopenia -Secondary to chemotherapy -Plts 121k, stable -Patient denies any symptoms of bleeding or excess bruising, such as epistaxis, hematochezia, melena, or hematuria -Continue treatment as outlined above   Chemotherapy-associated anemia -Secondary to chemotherapy -Hgb 11.9, stable -Patient denies any symptom of bleeding -Continue treatment as outlined above   Chemotherapy-associated leukopenia -Secondary to chemotherapy -WBC 3.4k with ANC ~1600, slightly lower than the last visit   -Patient denies any symptoms of infection -Continue treatment as outlined above  -If she develops progressive leukopenia or neutropenia in the future, we can omit the 5-FU bolus    Chemotherapy-associated neuropathy -Secondary to chemotherapy -Symptoms are primarily related to cold sensitivity; no persistent neuropathic symptoms when not exposed to cold  -Continue treatment as outlined above -If she develops progressive neuropathy not related to cold in the future, we can consider stopping oxaliplatin, as she has already received at least 3 months of oxaliplatin   Elevated LFT's  -Likely secondary to extensive liver masses -Alk phos 155, stable; AST, ALT and Tbili normal  -We will monitor it for now   Orders Placed This Encounter  Procedures  . CT CHEST W CONTRAST    Standing Status:   Future    Standing Expiration Date:   05/08/2020    Scheduling Instructions:     Pls schedule prior to 06/06/2019    Order Specific Question:   ** REASON FOR EXAM (FREE TEXT)    Answer:  On 1st line FOLFOX    Order Specific Question:   If indicated for the ordered procedure, I authorize the administration of contrast media per  Radiology protocol    Answer:   Yes    Order Specific Question:   Preferred imaging location?    Answer:   [facility] Specific Question:   Radiology Contrast Protocol - do NOT remove file path    Answer:   \\[email or web address]    Order Specific Question:   Is patient pregnant?    Answer:   No  . CT ABDOMEN PELVIS W CONTRAST    Standing Status:   Future    Standing Expiration Date:   05/08/2020    Scheduling Instructions:     Pls schedule prior to 06/06/2019    Order Specific Question:   ** REASON FOR EXAM (FREE TEXT)    Answer:   On 1st line FOLFOX    Order Specific Question:   If indicated for the ordered procedure, I authorize the administration of contrast media per Radiology protocol    Answer:   Yes    Order Specific Question:   Preferred imaging location?    Answer:   [facility] Specific Question:   Is Oral Contrast requested for this exam?    Answer:   Yes, Per Radiology protocol    Order Specific Question:   Radiology Contrast Protocol - do NOT remove file path    Answer:   \\[email or web address]    Order Specific Question:   Is patient pregnant?    Answer:   No   All questions were answered. The patient knows to call the clinic with any problems, questions or concerns. No barriers to learning was detected.  Return in 2 weeks for Cycle 8 of treatment, and in 4 weeks for CT results and Cycle 9 of treatment.   Tish Men, MD 4/27/20219:16 AM  CHIEF COMPLAINT: "I am doing fine so far"  INTERVAL HISTORY: Ms. Halfhill returns to clinic for follow-up of metastatic rectal adenocarcinoma on first-line FOLFOX.  Patient reports that she has been tolerating treatment relatively well, except mild cold sensitivity in the hands that lasts a few days after each dose of chemotherapy, but she denies any persistent numbness or tingling.  She also has occasional nausea, for which she manages it with food, but she  has not required frequent anti-emetics.  Her appetite is good, and she has gained a few pounds.  Her bowel movement is still somewhat irregular, but she is able to maintain soft BM's with Sennokot-S and Miralax.    REVIEW OF SYSTEMS:   Constitutional: ( - ) fevers, ( - )  chills , ( - ) night sweats Eyes: ( - ) blurriness of vision, ( - ) double vision, ( - ) watery eyes Ears, nose, mouth, throat, and face: ( - ) mucositis, ( - ) sore throat Respiratory: ( - ) cough, ( - ) dyspnea, ( - ) wheezes Cardiovascular: ( - ) palpitation, ( - ) chest discomfort, ( - ) lower extremity swelling Gastrointestinal:  ( + ) nausea, ( - ) heartburn, ( + ) change in bowel habits Skin: ( - ) abnormal skin rashes Lymphatics: ( - ) new lymphadenopathy, ( - ) easy bruising Neurological: ( - ) numbness, ( + ) tingling, ( - ) new weaknesses Behavioral/Psych: ( - ) mood change, ( - ) new changes  All other systems were reviewed with the  patient and are negative.  SUMMARY OF ONCOLOGIC HISTORY: Oncology History  Rectal adenocarcinoma ([place])  11/09/2018 Imaging   CT abdomen/pelvis w/ contrast: IMPRESSION: 1. Diffuse stool throughout the colon. No bowel obstruction. No abscess in the abdomen or pelvis. Appendix absent.   2. Multiple liver masses, likely multiple hemangiomas. Note that these lesions on current examination do not have classic hemangioma appearance. Given the size and multiplicity of these lesions, further assessment nonemergently would be advisable. In this regard, pre and serial post-contrast MR of the liver would be the optimum imaging study of choice to further evaluate. If there is a contraindication to MR, pre and serial post-contrast CT of the liver would be an alternative for further assessment.   3. 3 mm nodular opacity in the right middle lobe. No follow-up needed if patient is low-risk. Non-contrast chest CT can be considered in 12 months if patient is high-risk. This  recommendation follows the consensus statement: Guidelines for Management of Incidental Pulmonary Nodules Detected on CT Images: From the Fleischner Society 2017; Radiology 2017; 284:228-243.   4. No evident renal or ureteral calculus. No hydronephrosis. Urinary bladder wall thickness normal.   5.  Aortic Atherosclerosis (ICD10-I70.0).   01/20/2019 Procedure   Colonoscopy: - Hemorrhoids were found on perianal exam. - A frond-like/villous, fungating and ulcerated completely obstructing large mass was found in the proximal rectum, located approximately 10 cm from the anal verge. The mass was circumferential and non-traversable. Oozing was present. This was extensively biopsied with a cold forceps for histology. Path was sent as rush. Estimated blood loss was minimal. - Non-bleeding internal hemorrhoids were found during retroflexion. The hemorrhoids were large.   01/20/2019 Imaging   EGD: - The examined esophagus was normal. - The entire examined stomach was normal. Biopsies were taken with a cold forceps for Helicobacter pylori testing. Estimated blood loss was minimal. - The duodenal bulb, first portion of the duodenum and second portion of the duodenum were normal. Biopsies were taken with a cold forceps for histology. Estimated blood loss was minimal.   01/24/2019 Initial Diagnosis   Rectal adenocarcinoma ([place])   01/25/2019 Imaging   MRI abdomen: IMPRESSION: 1. Marked worsening of what is now characterized as hepatic metastatic disease, in this patient who now is known to have rectal cancer.   02/01/2019 Imaging   CT chest: IMPRESSION: 1. Multiple bulky hypodense, rim enhancing masses of the liver, which are in keeping with findings of recent prior MRI and greatly enlarged compared to prior CT dated 11/09/2018. The largest index mass of the central right lobe of the liver measures 9.6 x 9.1 cm, previously 4.2 x 3.9 cm (series 2, image 50). Findings are consistent with  hepatic metastatic disease. 2. There is circumferential thickening of the rectum, which appears increased compared to prior examination (series 2, image 25), in keeping with primary rectal malignancy. There is a soft tissue nodule adjacent to the superior rectum measuring 1.5 cm, enlarged compared to prior examination, previously 1.0 cm (series 2, image 97, series 5, image 101), consistent with perirectal nodal metastatic disease. 3. There is a 7 mm subpleural nodule of the anterior right middle lobe, significantly enlarged compared to prior examination, previously 3 mm (series 4, image 114). This is presumably pulmonary metastatic disease. 4. Biapical pleuroparenchymal scarring and multiple small nodules of the bilateral lung apices, the largest a 6 mm pulmonary nodule of the right apex (series 4, image 36). These findings are likely sequelae of prior infection or inflammation. Attention on follow-up. 5.  Aortic Atherosclerosis (ICD10-I70.0).   02/01/2019 Imaging   IMPRESSION: 1. Multiple bulky hypodense, rim enhancing masses of the liver, which are in keeping with findings of recent prior MRI and greatly enlarged compared to prior CT dated 11/09/2018. The largest index mass of the central right lobe of the liver measures 9.6 x 9.1 cm, previously 4.2 x 3.9 cm (series 2, image 50). Findings are consistent with hepatic metastatic disease. 2. There is circumferential thickening of the rectum, which appears increased compared to prior examination (series 2, image 25), in keeping with primary rectal malignancy. There is a soft tissue nodule adjacent to the superior rectum measuring 1.5 cm, enlarged compared to prior examination, previously 1.0 cm (series 2, image 97, series 5, image 101), consistent with perirectal nodal metastatic disease. 3. There is a 7 mm subpleural nodule of the anterior right middle lobe, significantly enlarged compared to prior examination, previously 3 mm  (series 4, image 114). This is presumably pulmonary metastatic disease. 4. Biapical pleuroparenchymal scarring and multiple small nodules of the bilateral lung apices, the largest a 6 mm pulmonary nodule of the right apex (series 4, image 36). These findings are likely sequelae of prior infection or inflammation. Attention on follow-up. 5.  Aortic Atherosclerosis (ICD10-I70.0).   02/07/2019 Pathology Results   FINAL MICROSCOPIC DIAGNOSIS:   A. LIVER, BIOPSY:  - Metastatic adenocarcinoma to the liver, consistent with patient's  clinical history of primary rectal adenocarcinoma.  See comment   02/09/2019 Cancer Staging   Staging form: Colon and Rectum, AJCC 8th Edition - Clinical: Stage Unknown (cTX, cNX, cM1) - Signed by Tish Men, MD on 02/09/2019   02/14/2019 -  Chemotherapy   The patient had palonosetron (ALOXI) injection 0.25 mg, 0.25 mg, Intravenous,  Once, 6 of 11 cycles Administration: 0.25 mg (02/14/2019), 0.25 mg (02/28/2019), 0.25 mg (03/14/2019), 0.25 mg (03/28/2019), 0.25 mg (04/11/2019), 0.25 mg (04/25/2019) leucovorin 668 mg in dextrose 5 % 250 mL infusion, 400 mg/m2 = 668 mg, Intravenous,  Once, 6 of 11 cycles Administration: 668 mg (02/14/2019), 668 mg (02/28/2019), 668 mg (03/14/2019), 668 mg (03/28/2019), 668 mg (04/11/2019), 668 mg (04/25/2019) oxaliplatin (ELOXATIN) 140 mg in dextrose 5 % 500 mL chemo infusion, 85 mg/m2 = 140 mg, Intravenous,  Once, 6 of 11 cycles Administration: 140 mg (02/14/2019), 140 mg (02/28/2019), 140 mg (03/14/2019), 140 mg (03/28/2019), 140 mg (04/11/2019), 140 mg (04/25/2019) fluorouracil (ADRUCIL) chemo injection 650 mg, 400 mg/m2 = 650 mg, Intravenous,  Once, 6 of 11 cycles Administration: 650 mg (02/14/2019), 650 mg (02/28/2019), 650 mg (03/14/2019), 650 mg (03/28/2019), 650 mg (04/11/2019), 650 mg (04/25/2019) fluorouracil (ADRUCIL) 4,000 mg in sodium chloride 0.9 % 70 mL chemo infusion, 2,400 mg/m2 = 4,000 mg, Intravenous, 1 Day/Dose, 6 of 11 cycles Administration: 4,000 mg  (02/14/2019), 4,000 mg (02/28/2019), 4,000 mg (03/14/2019), 4,000 mg (03/28/2019), 4,000 mg (04/11/2019), 4,000 mg (04/25/2019)  for chemotherapy treatment.    04/04/2019 Imaging   CT CAP: IMPRESSION: 1. Modest reduction in the size of the right middle lobe pulmonary nodule. Some of the liver masses are mildly reduced in volume compared to previous, others are stable. 2. Similar appearance of the local adenopathy adjacent to the upper rectal/rectosigmoid mass. 3. Wall thickening in the sigmoid colon is probably mainly from nondistention although a component of inflammation is not excluded. 4.  Aortic Atherosclerosis (ICD10-I70.0). 5. Subtle presacral edema. 6. No change in the nodular biapical pleuroparenchymal scarring.     I have reviewed the past medical history, past surgical history, social history and family  history with the patient and they are unchanged from previous note.  ALLERGIES:  has No Known Allergies.  MEDICATIONS:  Current Outpatient Medications  Medication Sig Dispense Refill  . AMBULATORY NON FORMULARY MEDICATION 2 tablets 2 (two) times daily. [place]    . dexamethasone (DECADRON) 4 MG tablet Take 2 tablets (8 mg total) by mouth daily. Start the day after chemotherapy for 2 days. Take with food. 30 tablet 1  . HYDROcodone-acetaminophen (NORCO) 5-325 MG tablet Take 1 tablet by mouth every 6 (six) hours as needed for moderate pain. 50 tablet 0  . hydrocortisone (PROCTOZONE-HC) 2.5 % rectal cream PLACE 1 APPLICATION RECTALLY 2 (TWO) TIMES DAILY. 30 g 0  . lidocaine-prilocaine (EMLA) cream Apply to affected area once 30 g 3  . LORazepam (ATIVAN) 0.5 MG tablet Take 1 tablet (0.5 mg total) by mouth every 6 (six) hours as needed (Nausea or vomiting). 30 tablet 0  . Lysine 500 MG TABS Take 1 tablet by mouth daily.    . Multiple Vitamin (MULTIVITAMIN WITH MINERALS) TABS tablet Take 1 tablet by mouth daily.    . ondansetron (ZOFRAN) 8 MG tablet Take 1 tablet (8 mg total) by mouth 2  (two) times daily as needed for refractory nausea / vomiting. Start on day 3 after chemotherapy. 30 tablet 1  . polyethylene glycol powder (GLYCOLAX/MIRALAX) 17 GM/SCOOP powder Take 17 g by mouth 2 (two) times daily as needed. 3350 g 1  . prochlorperazine (COMPAZINE) 10 MG tablet Take 1 tablet (10 mg total) by mouth every 6 (six) hours as needed (Nausea or vomiting). 30 tablet 1  . senna-docusate (SENNA-S) 8.6-50 MG tablet Take 1 tablet by mouth daily.     No current facility-administered medications for this visit.    PHYSICAL EXAMINATION: ECOG PERFORMANCE STATUS: 0 - Asymptomatic  Today's Vitals   05/09/19 0907  BP: 102/67  Pulse: 78  Resp: 18  Temp: (!) 97.1 F (36.2 C)  TempSrc: Temporal  SpO2: 100%  Weight: 130 lb (59 kg)  Height: 5' 8"  (1.727 m)  PainSc: 0-No pain   Body mass index is 19.77 kg/m.  Filed Weights   05/09/19 0907  Weight: 130 lb (59 kg)    GENERAL: alert, no distress and comfortable SKIN: skin color, texture, turgor are normal, no rashes or significant lesions EYES: conjunctiva are pink and non-injected, sclera clear OROPHARYNX: no exudate, no erythema; lips, buccal mucosa, and tongue normal  NECK: supple, non-tender LUNGS: clear to auscultation with normal breathing effort HEART: regular rate & rhythm and no murmurs and no lower extremity edema ABDOMEN: soft, non-tender, non-distended, normal bowel sounds Musculoskeletal: no cyanosis of digits and no clubbing  PSYCH: alert & oriented x 3, fluent speech  LABORATORY DATA:  I have reviewed the data as listed    Component Value Date/Time   NA 139 04/25/2019 0900   NA 141 11/02/2018 1654   K 3.7 04/25/2019 0900   CL 104 04/25/2019 0900   CO2 28 04/25/2019 0900   GLUCOSE 92 04/25/2019 0900   BUN 5 (L) 04/25/2019 0900   BUN 15 11/02/2018 1654   CREATININE 0.54 04/25/2019 0900   CALCIUM 9.3 04/25/2019 0900   PROT 6.1 (L) 04/25/2019 0900   PROT 6.7 11/02/2018 1654   ALBUMIN 3.9 04/25/2019 0900    ALBUMIN 4.7 11/02/2018 1654   AST 25 04/25/2019 0900   ALT 22 04/25/2019 0900   ALKPHOS 181 (H) 04/25/2019 0900   BILITOT 0.6 04/25/2019 0900   GFRNONAA >60 04/25/2019 0900  GFRAA >60 04/25/2019 0900    No results found for: SPEP, UPEP  Lab Results  Component Value Date   WBC 3.4 (L) 05/09/2019   NEUTROABS 1.6 (L) 05/09/2019   HGB 11.9 (L) 05/09/2019   HCT 34.7 (L) 05/09/2019   MCV 91.8 05/09/2019   PLT 121 (L) 05/09/2019      Chemistry      Component Value Date/Time   NA 139 04/25/2019 0900   NA 141 11/02/2018 1654   K 3.7 04/25/2019 0900   CL 104 04/25/2019 0900   CO2 28 04/25/2019 0900   BUN 5 (L) 04/25/2019 0900   BUN 15 11/02/2018 1654   CREATININE 0.54 04/25/2019 0900      Component Value Date/Time   CALCIUM 9.3 04/25/2019 0900   ALKPHOS 181 (H) 04/25/2019 0900   AST 25 04/25/2019 0900   ALT 22 04/25/2019 0900   BILITOT 0.6 04/25/2019 0900       RADIOGRAPHIC STUDIES: I have personally reviewed the radiological images as listed below and agreed with the findings in the report. No results found.

## 2019-05-09 NOTE — Patient Instructions (Signed)
[facility] Discharge Instructions for Patients Receiving Chemotherapy  Today you received the following chemotherapy agents 5FU, Oxaliplatin, Leucovorin  To help prevent nausea and vomiting after your treatment, we encourage you to take your nausea medication    If you develop nausea and vomiting that is not controlled by your nausea medication, call the clinic.   BELOW ARE SYMPTOMS THAT SHOULD BE REPORTED IMMEDIATELY:  *FEVER GREATER THAN 100.5 F  *CHILLS WITH OR WITHOUT FEVER  NAUSEA AND VOMITING THAT IS NOT CONTROLLED WITH YOUR NAUSEA MEDICATION  *UNUSUAL SHORTNESS OF BREATH  *UNUSUAL BRUISING OR BLEEDING  TENDERNESS IN MOUTH AND THROAT WITH OR WITHOUT PRESENCE OF ULCERS  *URINARY PROBLEMS  *BOWEL PROBLEMS  UNUSUAL RASH Items with * indicate a potential emergency and should be followed up as soon as possible.  Feel free to call the clinic should you have any questions or concerns. The clinic phone number is [phone].  Please show the CHEMO ALERT CARD at check-in to the Emergency Department and triage nurse.   

## 2019-05-09 NOTE — Telephone Encounter (Signed)
Per 4/27 los CT CAP w/ contrast prior to 06/06/2019 -No other changes

## 2019-05-10 ENCOUNTER — Encounter: Payer: Self-pay | Admitting: *Deleted

## 2019-05-11 ENCOUNTER — Other Ambulatory Visit: Payer: 59

## 2019-05-11 ENCOUNTER — Other Ambulatory Visit: Payer: Self-pay | Admitting: *Deleted

## 2019-05-11 ENCOUNTER — Inpatient Hospital Stay: Payer: 59

## 2019-05-11 ENCOUNTER — Other Ambulatory Visit: Payer: Self-pay

## 2019-05-11 ENCOUNTER — Other Ambulatory Visit: Payer: Self-pay | Admitting: Hematology

## 2019-05-11 VITALS — BP 94/64 | HR 72 | Resp 18

## 2019-05-11 DIAGNOSIS — Z95828 Presence of other vascular implants and grafts: Secondary | ICD-10-CM

## 2019-05-11 DIAGNOSIS — C20 Malignant neoplasm of rectum: Secondary | ICD-10-CM

## 2019-05-11 DIAGNOSIS — C787 Secondary malignant neoplasm of liver and intrahepatic bile duct: Secondary | ICD-10-CM | POA: Diagnosis not present

## 2019-05-11 DIAGNOSIS — Z452 Encounter for adjustment and management of vascular access device: Secondary | ICD-10-CM | POA: Diagnosis not present

## 2019-05-11 DIAGNOSIS — Z5111 Encounter for antineoplastic chemotherapy: Secondary | ICD-10-CM | POA: Diagnosis not present

## 2019-05-11 MED ORDER — LORAZEPAM 0.5 MG PO TABS: 0.5000 mg | ORAL_TABLET | Freq: Four times a day (QID) | ORAL | 0 refills | Status: DC | PRN

## 2019-05-11 MED ORDER — DEXAMETHASONE 4 MG PO TABS: 8.0000 mg | ORAL_TABLET | Freq: Every day | ORAL | 1 refills | Status: AC

## 2019-05-11 MED ORDER — HEPARIN SOD (PORK) LOCK FLUSH 100 UNIT/ML IV SOLN
500.0000 [IU] | Freq: Once | INTRAVENOUS | Status: AC
  Administered 2019-05-11: 500 [IU] via INTRAVENOUS
  Filled 2019-05-11: qty 5

## 2019-05-11 MED ORDER — SODIUM CHLORIDE 0.9% FLUSH
10.0000 mL | Freq: Once | INTRAVENOUS | Status: AC
  Administered 2019-05-11: 12:00:00 10 mL via INTRAVENOUS
  Filled 2019-05-11: qty 10

## 2019-05-11 MED ORDER — LORAZEPAM 0.5 MG PO TABS: 0.5000 mg | ORAL_TABLET | Freq: Every day | ORAL | 0 refills | Status: AC | PRN

## 2019-05-11 MED ORDER — HEPARIN SOD (PORK) LOCK FLUSH 100 UNIT/ML IV SOLN
500.0000 [IU] | Freq: Once | INTRAVENOUS | Status: DC | PRN
  Filled 2019-05-11: qty 5

## 2019-05-11 MED ORDER — PROCHLORPERAZINE MALEATE 10 MG PO TABS: 10.0000 mg | ORAL_TABLET | Freq: Four times a day (QID) | ORAL | 1 refills | Status: AC | PRN

## 2019-05-11 MED ORDER — SODIUM CHLORIDE 0.9% FLUSH
10.0000 mL | INTRAVENOUS | Status: DC | PRN
  Filled 2019-05-11: qty 10

## 2019-05-11 MED ORDER — ONDANSETRON HCL 8 MG PO TABS: 8.0000 mg | ORAL_TABLET | Freq: Two times a day (BID) | ORAL | 1 refills | Status: AC | PRN

## 2019-05-11 MED FILL — PROCHLORPERAZINE 10 MG TAB: 10 | 8 days supply | Qty: 30 | Fill #0

## 2019-05-11 MED FILL — ONDANSETRON HCL 8 MG TABLET: 8 | 15 days supply | Qty: 30 | Fill #0

## 2019-05-11 MED FILL — LORazepam 0.5 MG TABS: 0.5 | 30 days supply | Qty: 30 | Fill #0

## 2019-05-11 MED FILL — DEXAMETHASONE 4 MG TABLET: 4 | 15 days supply | Qty: 30 | Fill #0

## 2019-05-11 NOTE — Addendum Note (Signed)
Addended by: Lucile Crater on: 05/11/2019 12:03 PM   Modules accepted: Orders

## 2019-05-11 NOTE — Telephone Encounter (Signed)
Pt here for pump d/c, request for refills on medication

## 2019-05-11 NOTE — Patient Instructions (Signed)

## 2019-05-14 DIAGNOSIS — C20 Malignant neoplasm of rectum: Secondary | ICD-10-CM | POA: Diagnosis not present

## 2019-05-15 ENCOUNTER — Other Ambulatory Visit: Payer: Self-pay | Admitting: Family

## 2019-05-18 DIAGNOSIS — C20 Malignant neoplasm of rectum: Secondary | ICD-10-CM | POA: Diagnosis not present

## 2019-05-18 DIAGNOSIS — C787 Secondary malignant neoplasm of liver and intrahepatic bile duct: Secondary | ICD-10-CM | POA: Diagnosis not present

## 2019-05-19 DIAGNOSIS — C787 Secondary malignant neoplasm of liver and intrahepatic bile duct: Secondary | ICD-10-CM | POA: Diagnosis not present

## 2019-05-22 DIAGNOSIS — Z23 Encounter for immunization: Secondary | ICD-10-CM | POA: Diagnosis not present

## 2019-05-23 ENCOUNTER — Inpatient Hospital Stay: Payer: 59 | Attending: Hematology

## 2019-05-23 ENCOUNTER — Inpatient Hospital Stay: Payer: 59

## 2019-05-23 ENCOUNTER — Other Ambulatory Visit: Payer: Self-pay | Admitting: Hematology

## 2019-05-23 ENCOUNTER — Ambulatory Visit: Payer: 59 | Admitting: Hematology

## 2019-05-23 ENCOUNTER — Other Ambulatory Visit (HOSPITAL_BASED_OUTPATIENT_CLINIC_OR_DEPARTMENT_OTHER): Payer: 59

## 2019-05-23 ENCOUNTER — Other Ambulatory Visit: Payer: Self-pay

## 2019-05-23 DIAGNOSIS — C7800 Secondary malignant neoplasm of unspecified lung: Secondary | ICD-10-CM | POA: Diagnosis not present

## 2019-05-23 DIAGNOSIS — C20 Malignant neoplasm of rectum: Secondary | ICD-10-CM

## 2019-05-23 DIAGNOSIS — C787 Secondary malignant neoplasm of liver and intrahepatic bile duct: Secondary | ICD-10-CM | POA: Diagnosis not present

## 2019-05-23 DIAGNOSIS — Z5112 Encounter for antineoplastic immunotherapy: Secondary | ICD-10-CM | POA: Diagnosis not present

## 2019-05-23 DIAGNOSIS — Z5111 Encounter for antineoplastic chemotherapy: Secondary | ICD-10-CM | POA: Diagnosis not present

## 2019-05-23 LAB — CMP (CANCER CENTER ONLY)
ALT: 20 U/L (ref 0–44)
AST: 29 U/L (ref 15–41)
Albumin: 4 g/dL (ref 3.5–5.0)
Alkaline Phosphatase: 165 U/L — ABNORMAL HIGH (ref 38–126)
Anion gap: 6 (ref 5–15)
BUN: 13 mg/dL (ref 6–20)
CO2: 28 mmol/L (ref 22–32)
Calcium: 9.6 mg/dL (ref 8.9–10.3)
Chloride: 104 mmol/L (ref 98–111)
Creatinine: 0.62 mg/dL (ref 0.44–1.00)
GFR, Est AFR Am: >60 mL/min (ref >60)
GFR, Estimated: >60 mL/min (ref >60)
Glucose, Bld: 123 mg/dL — ABNORMAL HIGH (ref 70–99)
Potassium: 3.7 mmol/L (ref 3.5–5.1)
Sodium: 138 mmol/L (ref 135–145)
Total Bilirubin: 0.7 mg/dL (ref 0.3–1.2)
Total Protein: 6.4 g/dL — ABNORMAL LOW (ref 6.5–8.1)

## 2019-05-23 LAB — CBC WITH DIFFERENTIAL (CANCER CENTER ONLY)
Abs Immature Granulocytes: 0.01 10*3/uL (ref 0.00–0.07)
Basophils Absolute: 0 10*3/uL (ref 0.0–0.1)
Basophils Relative: 1 %
Eosinophils Absolute: 0.1 10*3/uL (ref 0.0–0.5)
Eosinophils Relative: 2 %
HCT: 35.1 % — ABNORMAL LOW (ref 36.0–46.0)
Hemoglobin: 11.9 g/dL — ABNORMAL LOW (ref 12.0–15.0)
Immature Granulocytes: 0 %
Lymphocytes Relative: 27 %
Lymphs Abs: 1 10*3/uL (ref 0.7–4.0)
MCH: 31.6 pg (ref 26.0–34.0)
MCHC: 33.9 g/dL (ref 30.0–36.0)
MCV: 93.1 fL (ref 80.0–100.0)
Monocytes Absolute: 0.6 10*3/uL (ref 0.1–1.0)
Monocytes Relative: 15 %
Neutro Abs: 2.1 10*3/uL (ref 1.7–7.7)
Neutrophils Relative %: 55 %
Platelet Count: 130 10*3/uL — ABNORMAL LOW (ref 150–400)
RBC: 3.77 MIL/uL — ABNORMAL LOW (ref 3.87–5.11)
RDW: 15.6 % — ABNORMAL HIGH (ref 11.5–15.5)
WBC Count: 3.7 10*3/uL — ABNORMAL LOW (ref 4.0–10.5)
nRBC: 0 % (ref 0.0–0.2)

## 2019-05-23 MED ORDER — DEXTROSE 5 % IV SOLN
Freq: Once | INTRAVENOUS | Status: AC
  Filled 2019-05-23: qty 250

## 2019-05-23 MED ORDER — PALONOSETRON HCL INJECTION 0.25 MG/5ML
0.2500 mg | Freq: Once | INTRAVENOUS | Status: AC
  Administered 2019-05-23: 0.25 mg via INTRAVENOUS

## 2019-05-23 MED ORDER — HEPARIN SOD (PORK) LOCK FLUSH 100 UNIT/ML IV SOLN
500.0000 [IU] | Freq: Once | INTRAVENOUS | Status: DC | PRN
  Filled 2019-05-23: qty 5

## 2019-05-23 MED ORDER — LEUCOVORIN CALCIUM INJECTION 350 MG
400.0000 mg/m2 | Freq: Once | INTRAVENOUS | Status: AC
  Administered 2019-05-23: 668 mg via INTRAVENOUS
  Filled 2019-05-23: qty 33.4

## 2019-05-23 MED ORDER — SODIUM CHLORIDE 0.9 % IV SOLN
10.0000 mg | Freq: Once | INTRAVENOUS | Status: AC
  Administered 2019-05-23: 10 mg via INTRAVENOUS
  Filled 2019-05-23: qty 10

## 2019-05-23 MED ORDER — SODIUM CHLORIDE 0.9% FLUSH
10.0000 mL | INTRAVENOUS | Status: DC | PRN
  Filled 2019-05-23: qty 10

## 2019-05-23 MED ORDER — OXALIPLATIN CHEMO INJECTION 100 MG/20ML
85.0000 mg/m2 | Freq: Once | INTRAVENOUS | Status: AC
  Administered 2019-05-23: 140 mg via INTRAVENOUS
  Filled 2019-05-23: qty 20

## 2019-05-23 MED ORDER — FLUOROURACIL CHEMO INJECTION 2.5 GM/50ML
400.0000 mg/m2 | Freq: Once | INTRAVENOUS | Status: AC
  Administered 2019-05-23: 650 mg via INTRAVENOUS
  Filled 2019-05-23: qty 13

## 2019-05-23 MED ORDER — SODIUM CHLORIDE 0.9 % IV SOLN
2400.0000 mg/m2 | INTRAVENOUS | Status: DC
  Administered 2019-05-23: 4000 mg via INTRAVENOUS
  Filled 2019-05-23: qty 80

## 2019-05-23 MED ORDER — PALONOSETRON HCL INJECTION 0.25 MG/5ML
INTRAVENOUS | Status: AC
  Filled 2019-05-23: qty 5

## 2019-05-23 NOTE — Addendum Note (Signed)
Addended by: Tish Men on: 05/23/2019 01:07 PM   Modules accepted: Orders

## 2019-05-23 NOTE — Progress Notes (Signed)
ON PATHWAY REGIMEN - Colorectal  No Change  Continue With Treatment as Ordered.     A cycle is every 14 days:     Oxaliplatin      Leucovorin      Fluorouracil      Fluorouracil   **Always confirm dose/schedule in your pharmacy ordering system**  Patient Characteristics: Distant Metastases, Nonsurgical Candidate, KRAS/NRAS Mutation Positive/Unknown (BRAF V600 Wild-Type/Unknown), Standard Cytotoxic Therapy, First Line Standard Cytotoxic Therapy, Bevacizumab Ineligible, PS = 0,1 Tumor Location: Rectal Therapeutic Status: Distant Metastases Microsatellite/Mismatch Repair Status: MSS/pMMR BRAF Mutation Status: Awaiting Test Results KRAS/NRAS Mutation Status: Awaiting Test Results Standard Cytotoxic Line of Therapy: First Line Standard Cytotoxic Therapy ECOG Performance Status: 0 Bevacizumab Eligibility: Ineligible Intent of Therapy: Non-Curative / Palliative Intent, Discussed with Patient

## 2019-05-23 NOTE — Progress Notes (Signed)
START ON PATHWAY REGIMEN - Colorectal     A cycle is every 14 days:     Bevacizumab-xxxx      Oxaliplatin      Leucovorin      Fluorouracil      Fluorouracil   **Always confirm dose/schedule in your pharmacy ordering system**  Patient Characteristics: Distant Metastases, Nonsurgical Candidate, KRAS/NRAS Mutation Positive/Unknown (BRAF V600 Wild-Type/Unknown), Standard Cytotoxic Therapy, First Line Standard Cytotoxic Therapy, Bevacizumab Eligible, PS = 0,1 Tumor Location: Rectal Therapeutic Status: Distant Metastases Microsatellite/Mismatch Repair Status: MSS/pMMR BRAF Mutation Status: Wild-Type (no mutation) KRAS/NRAS Mutation Status: Mutation Positive Standard Cytotoxic Line of Therapy: First Line Standard Cytotoxic Therapy ECOG Performance Status: 0 Bevacizumab Eligibility: Eligible Intent of Therapy: Non-Curative / Palliative Intent, Discussed with Patient

## 2019-05-23 NOTE — Patient Instructions (Signed)
Implanted Port Insertion, Care After °This sheet gives you information about how to care for yourself after your procedure. Your health care provider may also give you more specific instructions. If you have problems or questions, contact your health care provider. °What can I expect after the procedure? °After the procedure, it is common to have: °· Discomfort at the port insertion site. °· Bruising on the skin over the port. This should improve over 3-4 days. °Follow these instructions at home: °Port care °· After your port is placed, you will get a manufacturer's information card. The card has information about your port. Keep this card with you at all times. °· Take care of the port as told by your health care provider. Ask your health care provider if you or a family member can get training for taking care of the port at home. A home health care nurse may also take care of the port. °· Make sure to remember what type of port you have. °Incision care ° °  ° °· Follow instructions from your health care provider about how to take care of your port insertion site. Make sure you: °? Wash your hands with soap and water before and after you change your bandage (dressing). If soap and water are not available, use hand sanitizer. °? Change your dressing as told by your health care provider. °? Leave stitches (sutures), skin glue, or adhesive strips in place. These skin closures may need to stay in place for 2 weeks or longer. If adhesive strip edges start to loosen and curl up, you may trim the loose edges. Do not remove adhesive strips completely unless your health care provider tells you to do that. °· Check your port insertion site every day for signs of infection. Check for: °? Redness, swelling, or pain. °? Fluid or blood. °? Warmth. °? Pus or a bad smell. °Activity °· Return to your normal activities as told by your health care provider. Ask your health care provider what activities are safe for you. °· Do not  lift anything that is heavier than 10 lb (4.5 kg), or the limit that you are told, until your health care provider says that it is safe. °General instructions °· Take over-the-counter and prescription medicines only as told by your health care provider. °· Do not take baths, swim, or use a hot tub until your health care provider approves. Ask your health care provider if you may take showers. You may only be allowed to take sponge baths. °· Do not drive for 24 hours if you were given a sedative during your procedure. °· Wear a medical alert bracelet in case of an emergency. This will tell any health care providers that you have a port. °· Keep all follow-up visits as told by your health care provider. This is important. °Contact a health care provider if: °· You cannot flush your port with saline as directed, or you cannot draw blood from the port. °· You have a fever or chills. °· You have redness, swelling, or pain around your port insertion site. °· You have fluid or blood coming from your port insertion site. °· Your port insertion site feels warm to the touch. °· You have pus or a bad smell coming from the port insertion site. °Get help right away if: °· You have chest pain or shortness of breath. °· You have bleeding from your port that you cannot control. °Summary °· Take care of the port as told by your health   care provider. Keep the manufacturer's information card with you at all times. °· Change your dressing as told by your health care provider. °· Contact a health care provider if you have a fever or chills or if you have redness, swelling, or pain around your port insertion site. °· Keep all follow-up visits as told by your health care provider. °This information is not intended to replace advice given to you by your health care provider. Make sure you discuss any questions you have with your health care provider. °Document Revised: 07/27/2017 Document Reviewed: 07/27/2017 °Elsevier Patient Education ©  2020 [employer] ° °

## 2019-05-23 NOTE — Patient Instructions (Signed)
[facility] Discharge Instructions for Patients Receiving Chemotherapy  Today you received the following chemotherapy agents 5FU, Oxaliplatin, Leucovorin  To help prevent nausea and vomiting after your treatment, we encourage you to take your nausea medication    If you develop nausea and vomiting that is not controlled by your nausea medication, call the clinic.   BELOW ARE SYMPTOMS THAT SHOULD BE REPORTED IMMEDIATELY:  *FEVER GREATER THAN 100.5 F  *CHILLS WITH OR WITHOUT FEVER  NAUSEA AND VOMITING THAT IS NOT CONTROLLED WITH YOUR NAUSEA MEDICATION  *UNUSUAL SHORTNESS OF BREATH  *UNUSUAL BRUISING OR BLEEDING  TENDERNESS IN MOUTH AND THROAT WITH OR WITHOUT PRESENCE OF ULCERS  *URINARY PROBLEMS  *BOWEL PROBLEMS  UNUSUAL RASH Items with * indicate a potential emergency and should be followed up as soon as possible.  Feel free to call the clinic should you have any questions or concerns. The clinic phone number is [phone].  Please show the CHEMO ALERT CARD at check-in to the Emergency Department and triage nurse.   

## 2019-05-25 ENCOUNTER — Inpatient Hospital Stay: Payer: 59

## 2019-05-25 ENCOUNTER — Other Ambulatory Visit: Payer: Self-pay

## 2019-05-25 VITALS — BP 105/65 | HR 75 | Temp 97.1°F | Resp 18

## 2019-05-25 DIAGNOSIS — C20 Malignant neoplasm of rectum: Secondary | ICD-10-CM | POA: Diagnosis not present

## 2019-05-25 DIAGNOSIS — C787 Secondary malignant neoplasm of liver and intrahepatic bile duct: Secondary | ICD-10-CM | POA: Diagnosis not present

## 2019-05-25 DIAGNOSIS — Z5112 Encounter for antineoplastic immunotherapy: Secondary | ICD-10-CM | POA: Diagnosis not present

## 2019-05-25 DIAGNOSIS — C7800 Secondary malignant neoplasm of unspecified lung: Secondary | ICD-10-CM | POA: Diagnosis not present

## 2019-05-25 DIAGNOSIS — Z5111 Encounter for antineoplastic chemotherapy: Secondary | ICD-10-CM | POA: Diagnosis not present

## 2019-05-25 MED ORDER — HEPARIN SOD (PORK) LOCK FLUSH 100 UNIT/ML IV SOLN
500.0000 [IU] | Freq: Once | INTRAVENOUS | Status: AC | PRN
  Administered 2019-05-25: 500 [IU]
  Filled 2019-05-25: qty 5

## 2019-05-25 MED ORDER — SODIUM CHLORIDE 0.9% FLUSH
10.0000 mL | INTRAVENOUS | Status: DC | PRN
  Administered 2019-05-25: 10 mL
  Filled 2019-05-25: qty 10

## 2019-05-25 NOTE — Patient Instructions (Signed)

## 2019-05-29 ENCOUNTER — Encounter: Payer: Self-pay | Admitting: Pharmacist

## 2019-05-29 ENCOUNTER — Encounter: Payer: Self-pay | Admitting: *Deleted

## 2019-05-29 NOTE — Progress Notes (Signed)
The following biosimilar Mvasi (bevacizumab-awwb) has been selected for use in this patient. Pref by insur.  Kennith Center, Pharm.D., CPP 05/29/2019@12 :42 PM

## 2019-05-30 ENCOUNTER — Other Ambulatory Visit: Payer: Self-pay | Admitting: Hematology

## 2019-05-30 ENCOUNTER — Ambulatory Visit
Admission: RE | Admit: 2019-05-30 | Discharge: 2019-05-30 | Disposition: A | Discharge status: Home / Self Care | Payer: 59 | Source: Ambulatory Visit | Attending: Hematology | Admitting: Hematology

## 2019-05-30 DIAGNOSIS — C20 Malignant neoplasm of rectum: Secondary | ICD-10-CM

## 2019-05-30 DIAGNOSIS — C787 Secondary malignant neoplasm of liver and intrahepatic bile duct: Secondary | ICD-10-CM | POA: Diagnosis not present

## 2019-05-30 MED ORDER — IOPAMIDOL (ISOVUE-300) INJECTION 61%
100.0000 mL | Freq: Once | INTRAVENOUS | Status: AC | PRN
  Administered 2019-05-30: 100 mL via INTRAVENOUS

## 2019-05-30 NOTE — Progress Notes (Addendum)
Pharmacist Chemotherapy Monitoring - Follow Up Assessment    I verify that I have reviewed each item in the below checklist:  . Regimen for the patient is scheduled for the appropriate day and plan matches scheduled date. Marland Kitchen Appropriate non-routine labs are ordered dependent on drug ordered. . If applicable, additional medications reviewed and ordered per protocol based on lifetime cumulative doses and/or treatment regimen.   Plan for follow-up and/or issues identified: Yes . I-vent associated with next due treatment: Yes . MD and/or nursing notified: No  DC Dex 10mg  and Continue Aloxi as premedications with the dc of Oxaliplatin per MD.  Acquanetta Belling 05/30/2019 8:29 AM

## 2019-06-02 ENCOUNTER — Telehealth: Payer: Self-pay | Admitting: *Deleted

## 2019-06-02 NOTE — Telephone Encounter (Signed)
-----   Message from Tish Men, MD sent at 06/01/2019  2:24 PM EDT ----- Delrae Sawyers, Can you let Ms. Siodr know that her CT showed improving disease/Thanks.  Cot[place]  ----- Message ----- From: Interface, Rad Results In Sent: 05/30/2019   1:45 PM EDT To: Tish Men, MD

## 2019-06-02 NOTE — Telephone Encounter (Signed)
As noted below by Dr. Maylon Peppers, I informed the patient that the CT scan showed improving disease. She verbalized understanding.

## 2019-06-06 ENCOUNTER — Other Ambulatory Visit: Payer: Self-pay

## 2019-06-06 ENCOUNTER — Encounter: Payer: Self-pay | Admitting: *Deleted

## 2019-06-06 ENCOUNTER — Telehealth: Payer: Self-pay | Admitting: Hematology & Oncology

## 2019-06-06 ENCOUNTER — Encounter (INDEPENDENT_AMBULATORY_CARE_PROVIDER_SITE_OTHER): Payer: Self-pay

## 2019-06-06 ENCOUNTER — Encounter: Payer: Self-pay | Admitting: Hematology & Oncology

## 2019-06-06 ENCOUNTER — Inpatient Hospital Stay: Payer: 59

## 2019-06-06 ENCOUNTER — Inpatient Hospital Stay (HOSPITAL_BASED_OUTPATIENT_CLINIC_OR_DEPARTMENT_OTHER): Payer: 59 | Admitting: Hematology & Oncology

## 2019-06-06 VITALS — BP 89/69 | HR 84 | Temp 97.5°F | Resp 19 | Wt 130.0 lb

## 2019-06-06 DIAGNOSIS — C20 Malignant neoplasm of rectum: Secondary | ICD-10-CM

## 2019-06-06 DIAGNOSIS — C7800 Secondary malignant neoplasm of unspecified lung: Secondary | ICD-10-CM | POA: Diagnosis not present

## 2019-06-06 DIAGNOSIS — Z5112 Encounter for antineoplastic immunotherapy: Secondary | ICD-10-CM | POA: Diagnosis not present

## 2019-06-06 DIAGNOSIS — R808 Other proteinuria: Secondary | ICD-10-CM

## 2019-06-06 DIAGNOSIS — C787 Secondary malignant neoplasm of liver and intrahepatic bile duct: Secondary | ICD-10-CM | POA: Diagnosis not present

## 2019-06-06 DIAGNOSIS — Z95828 Presence of other vascular implants and grafts: Secondary | ICD-10-CM

## 2019-06-06 DIAGNOSIS — Z5111 Encounter for antineoplastic chemotherapy: Secondary | ICD-10-CM | POA: Diagnosis not present

## 2019-06-06 LAB — CBC WITH DIFFERENTIAL (CANCER CENTER ONLY)
Abs Immature Granulocytes: 0.03 10*3/uL (ref 0.00–0.07)
Basophils Absolute: 0 10*3/uL (ref 0.0–0.1)
Basophils Relative: 1 %
Eosinophils Absolute: 0.1 10*3/uL (ref 0.0–0.5)
Eosinophils Relative: 3 %
HCT: 34.8 % — ABNORMAL LOW (ref 36.0–46.0)
Hemoglobin: 11.9 g/dL — ABNORMAL LOW (ref 12.0–15.0)
Immature Granulocytes: 1 %
Lymphocytes Relative: 30 %
Lymphs Abs: 1 10*3/uL (ref 0.7–4.0)
MCH: 32.5 pg (ref 26.0–34.0)
MCHC: 34.2 g/dL (ref 30.0–36.0)
MCV: 95.1 fL (ref 80.0–100.0)
Monocytes Absolute: 0.6 10*3/uL (ref 0.1–1.0)
Monocytes Relative: 19 %
Neutro Abs: 1.6 10*3/uL — ABNORMAL LOW (ref 1.7–7.7)
Neutrophils Relative %: 46 %
Platelet Count: 121 10*3/uL — ABNORMAL LOW (ref 150–400)
RBC: 3.66 MIL/uL — ABNORMAL LOW (ref 3.87–5.11)
RDW: 15.2 % (ref 11.5–15.5)
WBC Count: 3.4 10*3/uL — ABNORMAL LOW (ref 4.0–10.5)
nRBC: 0 % (ref 0.0–0.2)

## 2019-06-06 LAB — CMP (CANCER CENTER ONLY)
ALT: 20 U/L (ref 0–44)
AST: 30 U/L (ref 15–41)
Albumin: 3.9 g/dL (ref 3.5–5.0)
Alkaline Phosphatase: 166 U/L — ABNORMAL HIGH (ref 38–126)
Anion gap: 5 (ref 5–15)
BUN: 10 mg/dL (ref 6–20)
CO2: 28 mmol/L (ref 22–32)
Calcium: 9.3 mg/dL (ref 8.9–10.3)
Chloride: 105 mmol/L (ref 98–111)
Creatinine: 0.6 mg/dL (ref 0.44–1.00)
GFR, Est AFR Am: >60 mL/min (ref >60)
GFR, Estimated: >60 mL/min (ref >60)
Glucose, Bld: 103 mg/dL — ABNORMAL HIGH (ref 70–99)
Potassium: 4 mmol/L (ref 3.5–5.1)
Sodium: 138 mmol/L (ref 135–145)
Total Bilirubin: 0.7 mg/dL (ref 0.3–1.2)
Total Protein: 6.6 g/dL (ref 6.5–8.1)

## 2019-06-06 LAB — TOTAL PROTEIN, URINE DIPSTICK: Protein, ur: NEGATIVE mg/dL

## 2019-06-06 MED ORDER — FLUOROURACIL CHEMO INJECTION 2.5 GM/50ML
400.0000 mg/m2 | Freq: Once | INTRAVENOUS | Status: AC
  Administered 2019-06-06: 650 mg via INTRAVENOUS
  Filled 2019-06-06: qty 13

## 2019-06-06 MED ORDER — SODIUM CHLORIDE 0.9% FLUSH
10.0000 mL | Freq: Once | INTRAVENOUS | Status: AC
  Administered 2019-06-06: 10 mL via INTRAVENOUS
  Filled 2019-06-06: qty 10

## 2019-06-06 MED ORDER — PALONOSETRON HCL INJECTION 0.25 MG/5ML
INTRAVENOUS | Status: AC
  Filled 2019-06-06: qty 5

## 2019-06-06 MED ORDER — SODIUM CHLORIDE 0.9 % IV SOLN
2400.0000 mg/m2 | INTRAVENOUS | Status: DC
  Administered 2019-06-06: 4000 mg via INTRAVENOUS
  Filled 2019-06-06: qty 80

## 2019-06-06 MED ORDER — LEUCOVORIN CALCIUM INJECTION 350 MG
400.0000 mg/m2 | Freq: Once | INTRAVENOUS | Status: AC
  Administered 2019-06-06: 668 mg via INTRAVENOUS
  Filled 2019-06-06: qty 33.4

## 2019-06-06 MED ORDER — PALONOSETRON HCL INJECTION 0.25 MG/5ML
0.2500 mg | Freq: Once | INTRAVENOUS | Status: AC
  Administered 2019-06-06: 0.25 mg via INTRAVENOUS

## 2019-06-06 MED ORDER — SODIUM CHLORIDE 0.9 % IV SOLN
Freq: Once | INTRAVENOUS | Status: AC
  Filled 2019-06-06: qty 250

## 2019-06-06 MED ORDER — SODIUM CHLORIDE 0.9 % IV SOLN
5.0000 mg/kg | Freq: Once | INTRAVENOUS | Status: AC
  Administered 2019-06-06: 300 mg via INTRAVENOUS
  Filled 2019-06-06: qty 12

## 2019-06-06 NOTE — Telephone Encounter (Signed)
Appointments scheduled calendar printed per 5/25 los 

## 2019-06-06 NOTE — Patient Instructions (Signed)
[facility] Discharge Instructions for Patients Receiving Chemotherapy  Today you received the following chemotherapy agents Bevacizumab-Awwb, Leukovorin, 5FU  To help prevent nausea and vomiting after your treatment, we encourage you to take your nausea medication as directed  If you develop nausea and vomiting that is not controlled by your nausea medication, call the clinic.   BELOW ARE SYMPTOMS THAT SHOULD BE REPORTED IMMEDIATELY:  *FEVER GREATER THAN 100.5 F  *CHILLS WITH OR WITHOUT FEVER  NAUSEA AND VOMITING THAT IS NOT CONTROLLED WITH YOUR NAUSEA MEDICATION  *UNUSUAL SHORTNESS OF BREATH  *UNUSUAL BRUISING OR BLEEDING  TENDERNESS IN MOUTH AND THROAT WITH OR WITHOUT PRESENCE OF ULCERS  *URINARY PROBLEMS  *BOWEL PROBLEMS  UNUSUAL RASH Items with * indicate a potential emergency and should be followed up as soon as possible.  Feel free to call the clinic should you have any questions or concerns. The clinic phone number is [phone].  Please show the [place] at check-in to the Emergency Department and triage nurse.    Bevacizumab injection What is this medicine? BEVACIZUMAB (be va SIZ yoo mab) is a monoclonal antibody. It is used to treat many types of cancer. This medicine may be used for other purposes; ask your health care provider or pharmacist if you have questions. COMMON BRAND NAME(S): Avastin, MVASI, Zirabev What should I tell my health care provider before I take this medicine? They need to know if you have any of these conditions:  diabetes  heart disease  high blood pressure  history of coughing up blood  prior anthracycline chemotherapy (e.g., doxorubicin, daunorubicin, epirubicin)  recent or ongoing radiation therapy  recent or planning to have surgery  stroke  an unusual or allergic reaction to bevacizumab, hamster proteins, mouse proteins, other medicines, foods, dyes, or preservatives  pregnant or trying to  get pregnant  breast-feeding How should I use this medicine? This medicine is for infusion into a vein. It is given by a health care professional in a hospital or clinic setting. Talk to your pediatrician regarding the use of this medicine in children. Special care may be needed. Overdosage: If you think you have taken too much of this medicine contact a poison control center or emergency room at once. NOTE: This medicine is only for you. Do not share this medicine with others. What if I miss a dose? It is important not to miss your dose. Call your doctor or health care professional if you are unable to keep an appointment. What may interact with this medicine? Interactions are not expected. This list may not describe all possible interactions. Give your health care provider a list of all the medicines, herbs, non-prescription drugs, or dietary supplements you use. Also tell them if you smoke, drink alcohol, or use illegal drugs. Some items may interact with your medicine. What should I watch for while using this medicine? Your condition will be monitored carefully while you are receiving this medicine. You will need important blood work and urine testing done while you are taking this medicine. This medicine may increase your risk to bruise or bleed. Call your doctor or health care professional if you notice any unusual bleeding. Before having surgery, talk to your health care provider to make sure it is ok. This drug can increase the risk of poor healing of your surgical site or wound. You will need to stop this drug for 28 days before surgery. After surgery, wait at least 28 days before restarting this drug. Make  sure the surgical site or wound is healed enough before restarting this drug. Talk to your health care provider if questions. Do not become pregnant while taking this medicine or for 6 months after stopping it. Women should inform their doctor if they wish to become pregnant or think  they might be pregnant. There is a potential for serious side effects to an unborn child. Talk to your health care professional or pharmacist for more information. Do not breast-feed an infant while taking this medicine and for 6 months after the last dose. This medicine has caused ovarian failure in some women. This medicine may interfere with the ability to have a child. You should talk to your doctor or health care professional if you are concerned about your fertility. What side effects may I notice from receiving this medicine? Side effects that you should report to your doctor or health care professional as soon as possible:  allergic reactions like skin rash, itching or hives, swelling of the face, lips, or tongue  chest pain or chest tightness  chills  coughing up blood  high fever  seizures  severe constipation  signs and symptoms of bleeding such as bloody or black, tarry stools; red or dark-brown urine; spitting up blood or brown material that looks like coffee grounds; red spots on the skin; unusual bruising or bleeding from the eye, gums, or nose  signs and symptoms of a blood clot such as breathing problems; chest pain; severe, sudden headache; pain, swelling, warmth in the leg  signs and symptoms of a stroke like changes in vision; confusion; trouble speaking or understanding; severe headaches; sudden numbness or weakness of the face, arm or leg; trouble walking; dizziness; loss of balance or coordination  stomach pain  sweating  swelling of legs or ankles  vomiting  weight gain Side effects that usually do not require medical attention (report to your doctor or health care professional if they continue or are bothersome):  back pain  changes in taste  decreased appetite  dry skin  nausea  tiredness This list may not describe all possible side effects. Call your doctor for medical advice about side effects. You may report side effects to FDA at  1-800-FDA-1088. Where should I keep my medicine? This drug is given in a hospital or clinic and will not be stored at home. NOTE: This sheet is a summary. It may not cover all possible information. If you have questions about this medicine, talk to your doctor, pharmacist, or health care provider.  2020 Elsevier/Gold Standard (2018-10-26 10:50:46)

## 2019-06-06 NOTE — Progress Notes (Signed)
Hematology and Oncology Follow Up Visit  Sharon Harris [id number] 09/07/1965 54 y.o. 06/06/2019   Principle Diagnosis:   Metastatic adenocarcinoma of the rectum -- liver/lung mets -- KRAS (+)/ HER-2 (+)  Current Therapy:    FOLFOX -- s/p cycle # 8     Interim History:  Sharon Harris is back for follow-up.  This is my first time seeing her.  She was followed by Dr. Maylon Peppers.  She is very nice.  She is really from [place].  She is only been down here about a year.  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  She presented with metastatic disease.  She never had a colonoscopy.  She had a rectal primary.  She had liver and lung metastasis.  She actually was seen at [facility].  She saw the incredibly esteemed Dr. Duard Brady.  He made some recommendations.  He agreed with the FOLFOX treatment.  He would discontinue the oxaliplatin and probably add Avastin.  She really looks good.  She has been active.  She lives down in [place].  She has couple horses.  She has a farm.  She has been outside..  She is eating pretty well.  She is not have any problems with bowels or bladder.  Maybe a little bit of constipation.  She has had no cough or shortness of breath.  She had no mouth sores.  She may have a little bit of tingling in the fingers but really not all that bad.  Overall, I would say her performance status is ECOG 0.  Medications:  Current Outpatient Medications:  .  AMBULATORY NON FORMULARY MEDICATION, 2 tablets 2 (two) times daily. [place], Disp: , Rfl:  .  dexamethasone (DECADRON) 4 MG tablet, Take 2 tablets (8 mg total) by mouth daily. Start the day after chemotherapy for 2 days. Take with food., Disp: 30 tablet, Rfl: 1 .  HYDROcodone-acetaminophen (NORCO) 5-325 MG tablet, Take 1 tablet by mouth every 6 (six) hours as needed for moderate pain., Disp: 50 tablet, Rfl: 0 .  hydrocortisone (PROCTOZONE-HC) 2.5 % rectal cream, PLACE 1 APPLICATION RECTALLY 2 (TWO)  TIMES DAILY., Disp: 30 g, Rfl: 0 .  lidocaine-prilocaine (EMLA) cream, Apply to affected area once, Disp: 30 g, Rfl: 3 .  LORazepam (ATIVAN) 0.5 MG tablet, Take 1 tablet (0.5 mg total) by mouth daily as needed (Nausea or vomiting)., Disp: 30 tablet, Rfl: 0 .  Lysine 500 MG TABS, Take 1 tablet by mouth daily., Disp: , Rfl:  .  Multiple Vitamin (MULTIVITAMIN WITH MINERALS) TABS tablet, Take 1 tablet by mouth daily., Disp: , Rfl:  .  ondansetron (ZOFRAN) 8 MG tablet, Take 1 tablet (8 mg total) by mouth 2 (two) times daily as needed for refractory nausea / vomiting. Start on day 3 after chemotherapy., Disp: 30 tablet, Rfl: 1 .  polyethylene glycol powder (GLYCOLAX/MIRALAX) 17 GM/SCOOP powder, Take 17 g by mouth 2 (two) times daily as needed., Disp: 3350 g, Rfl: 1 .  prochlorperazine (COMPAZINE) 10 MG tablet, Take 1 tablet (10 mg total) by mouth every 6 (six) hours as needed (Nausea or vomiting)., Disp: 30 tablet, Rfl: 1 .  senna-docusate (SENNA-S) 8.6-50 MG tablet, Take 1 tablet by mouth 2 (two) times daily. , Disp: , Rfl:   Allergies: No Known Allergies  Past Medical History, Surgical history, Social history, and Family History were reviewed and updated.  Review of Systems: Review of Systems  Constitutional: Negative.   HENT:  Negative.  Eyes: Negative.   Respiratory: Negative.   Cardiovascular: Negative.   Gastrointestinal: Negative.   Endocrine: Negative.   Genitourinary: Negative.    Musculoskeletal: Negative.   Skin: Negative.   Neurological: Negative.   Hematological: Negative.   Psychiatric/Behavioral: Negative.     Physical Exam:  vitals were not taken for this visit.   Wt Readings from Last 3 Encounters:  06/06/19 130 lb (59 kg)  05/09/19 130 lb (59 kg)  04/25/19 126 lb (57.2 kg)    Physical Exam Vitals reviewed.  HENT:     Head: Normocephalic and atraumatic.  Eyes:     Pupils: Pupils are equal, round, and reactive to light.  Cardiovascular:     Rate and Rhythm:  Normal rate and regular rhythm.     Heart sounds: Normal heart sounds.  Pulmonary:     Effort: Pulmonary effort is normal.     Breath sounds: Normal breath sounds.  Abdominal:     General: Bowel sounds are normal.     Palpations: Abdomen is soft.     Comments: Abdominal exam shows a soft abdomen with good bowel sounds.  There is the left lobe of the liver which can be palpated just below the sternal margin of the rib cage.  This is just over the midline.  It is nontender.  She has good bowel sounds.  There is no fluid wave.  There is no splenomegaly.  Musculoskeletal:        General: No tenderness or deformity. Normal range of motion.     Cervical back: Normal range of motion.  Lymphadenopathy:     Cervical: No cervical adenopathy.  Skin:    General: Skin is warm and dry.     Findings: No erythema or rash.  Neurological:     Mental Status: She is alert and oriented to person, place, and time.  Psychiatric:        Behavior: Behavior normal.        Thought Content: Thought content normal.        Judgment: Judgment normal.      Lab Results  Component Value Date   WBC 3.4 (L) 06/06/2019   HGB 11.9 (L) 06/06/2019   HCT 34.8 (L) 06/06/2019   MCV 95.1 06/06/2019   PLT 121 (L) 06/06/2019     Chemistry      Component Value Date/Time   NA 138 06/06/2019 0828   NA 141 11/02/2018 1654   K 4.0 06/06/2019 0828   CL 105 06/06/2019 0828   CO2 28 06/06/2019 0828   BUN 10 06/06/2019 0828   BUN 15 11/02/2018 1654   CREATININE 0.60 06/06/2019 0828      Component Value Date/Time   CALCIUM 9.3 06/06/2019 0828   ALKPHOS 166 (H) 06/06/2019 0828   AST 30 06/06/2019 0828   ALT 20 06/06/2019 0828   BILITOT 0.7 06/06/2019 0828       Impression and Plan: Sharon Harris is a very nice 54 year old white female.  She is from [place].  We had a good time talking about [place].  She is from the extreme [place].  She seems to be doing quite well.  For right  now, we will go with FOLFOX.  The next time that we see her, I will add in the Avastin.  Hopefully, she will continue to have a good response.  I will plan to have her come back in 2 weeks.  I am just glad that  her quality life is doing so well.  She obviously has gotten incredibly great care from Dr. Maylon Peppers.   Volanda Napoleon, MD 5/25/20219:02 AM

## 2019-06-07 ENCOUNTER — Other Ambulatory Visit: Payer: 59

## 2019-06-08 ENCOUNTER — Other Ambulatory Visit: Payer: Self-pay | Admitting: Family

## 2019-06-08 ENCOUNTER — Inpatient Hospital Stay: Payer: 59

## 2019-06-08 ENCOUNTER — Other Ambulatory Visit: Payer: Self-pay

## 2019-06-08 VITALS — BP 105/69 | HR 77 | Temp 97.7°F | Resp 17

## 2019-06-08 DIAGNOSIS — C20 Malignant neoplasm of rectum: Secondary | ICD-10-CM | POA: Diagnosis not present

## 2019-06-08 DIAGNOSIS — Z5111 Encounter for antineoplastic chemotherapy: Secondary | ICD-10-CM | POA: Diagnosis not present

## 2019-06-08 DIAGNOSIS — Z5112 Encounter for antineoplastic immunotherapy: Secondary | ICD-10-CM | POA: Diagnosis not present

## 2019-06-08 DIAGNOSIS — C787 Secondary malignant neoplasm of liver and intrahepatic bile duct: Secondary | ICD-10-CM | POA: Diagnosis not present

## 2019-06-08 DIAGNOSIS — C7800 Secondary malignant neoplasm of unspecified lung: Secondary | ICD-10-CM | POA: Diagnosis not present

## 2019-06-08 MED ORDER — HEPARIN SOD (PORK) LOCK FLUSH 100 UNIT/ML IV SOLN
500.0000 [IU] | Freq: Once | INTRAVENOUS | Status: AC | PRN
  Administered 2019-06-08: 500 [IU]
  Filled 2019-06-08: qty 5

## 2019-06-08 MED ORDER — SODIUM CHLORIDE 0.9% FLUSH
10.0000 mL | INTRAVENOUS | Status: DC | PRN
  Administered 2019-06-08: 10 mL
  Filled 2019-06-08: qty 10

## 2019-06-08 NOTE — Patient Instructions (Signed)

## 2019-06-09 NOTE — Progress Notes (Deleted)
Pharmacist Chemotherapy Monitoring - Follow Up Assessment    I verify that I have reviewed each item in the below checklist:  . Regimen for the patient is scheduled for the appropriate day and plan matches scheduled date. Marland Kitchen Appropriate non-routine labs are ordered dependent on drug ordered. . If applicable, additional medications reviewed and ordered per protocol based on lifetime cumulative doses and/or treatment regimen.   Plan for follow-up and/or issues identified: No . I-vent associated with next due treatment: No . MD and/or nursing notified: No  Sharon Harris, Sharon Harris 06/09/2019 8:01 AM

## 2019-06-09 NOTE — Progress Notes (Signed)
Pharmacist Chemotherapy Monitoring - Follow Up Assessment    I verify that I have reviewed each item in the below checklist:  . Regimen for the patient is scheduled for the appropriate day and plan matches scheduled date. Marland Kitchen Appropriate non-routine labs are ordered dependent on drug ordered. . If applicable, additional medications reviewed and ordered per protocol based on lifetime cumulative doses and/or treatment regimen.   Plan for follow-up and/or issues identified: Yes . I-vent associated with next due treatment: Yes . MD and/or nursing notified: No  Yasamin Karel, [name] 06/09/2019 8:03 AM

## 2019-06-12 DIAGNOSIS — Z23 Encounter for immunization: Secondary | ICD-10-CM | POA: Diagnosis not present

## 2019-06-14 DIAGNOSIS — C20 Malignant neoplasm of rectum: Secondary | ICD-10-CM | POA: Diagnosis not present

## 2019-06-16 ENCOUNTER — Telehealth: Payer: Self-pay | Admitting: Hematology & Oncology

## 2019-06-16 NOTE — Telephone Encounter (Signed)
Release F.P:[phone] Faxed medical records to [facility] @ [phone]

## 2019-06-19 ENCOUNTER — Inpatient Hospital Stay: Payer: 59

## 2019-06-19 ENCOUNTER — Other Ambulatory Visit: Payer: Self-pay | Admitting: Family

## 2019-06-19 ENCOUNTER — Inpatient Hospital Stay: Payer: 59 | Attending: Hematology

## 2019-06-19 ENCOUNTER — Other Ambulatory Visit: Payer: Self-pay

## 2019-06-19 VITALS — BP 99/69 | HR 77 | Temp 97.7°F

## 2019-06-19 DIAGNOSIS — C20 Malignant neoplasm of rectum: Secondary | ICD-10-CM | POA: Diagnosis not present

## 2019-06-19 DIAGNOSIS — Z5112 Encounter for antineoplastic immunotherapy: Secondary | ICD-10-CM | POA: Diagnosis not present

## 2019-06-19 DIAGNOSIS — Z5111 Encounter for antineoplastic chemotherapy: Secondary | ICD-10-CM | POA: Diagnosis not present

## 2019-06-19 LAB — CMP (CANCER CENTER ONLY)
ALT: 22 U/L (ref 0–44)
AST: 31 U/L (ref 15–41)
Albumin: 4 g/dL (ref 3.5–5.0)
Alkaline Phosphatase: 171 U/L — ABNORMAL HIGH (ref 38–126)
Anion gap: 8 (ref 5–15)
BUN: 8 mg/dL (ref 6–20)
CO2: 28 mmol/L (ref 22–32)
Calcium: 9.5 mg/dL (ref 8.9–10.3)
Chloride: 103 mmol/L (ref 98–111)
Creatinine: 0.74 mg/dL (ref 0.44–1.00)
GFR, Est AFR Am: >60 mL/min (ref >60)
GFR, Estimated: >60 mL/min (ref >60)
Glucose, Bld: 117 mg/dL — ABNORMAL HIGH (ref 70–99)
Potassium: 3.6 mmol/L (ref 3.5–5.1)
Sodium: 139 mmol/L (ref 135–145)
Total Bilirubin: 0.9 mg/dL (ref 0.3–1.2)
Total Protein: 6.8 g/dL (ref 6.5–8.1)

## 2019-06-19 LAB — CBC WITH DIFFERENTIAL (CANCER CENTER ONLY)
Abs Immature Granulocytes: 0.03 10*3/uL (ref 0.00–0.07)
Basophils Absolute: 0.1 10*3/uL (ref 0.0–0.1)
Basophils Relative: 1 %
Eosinophils Absolute: 0.2 10*3/uL (ref 0.0–0.5)
Eosinophils Relative: 4 %
HCT: 37 % (ref 36.0–46.0)
Hemoglobin: 12.6 g/dL (ref 12.0–15.0)
Immature Granulocytes: 1 %
Lymphocytes Relative: 22 %
Lymphs Abs: 1 10*3/uL (ref 0.7–4.0)
MCH: 32.8 pg (ref 26.0–34.0)
MCHC: 34.1 g/dL (ref 30.0–36.0)
MCV: 96.4 fL (ref 80.0–100.0)
Monocytes Absolute: 0.6 10*3/uL (ref 0.1–1.0)
Monocytes Relative: 13 %
Neutro Abs: 2.9 10*3/uL (ref 1.7–7.7)
Neutrophils Relative %: 59 %
Platelet Count: 127 10*3/uL — ABNORMAL LOW (ref 150–400)
RBC: 3.84 MIL/uL — ABNORMAL LOW (ref 3.87–5.11)
RDW: 15.1 % (ref 11.5–15.5)
WBC Count: 4.8 10*3/uL (ref 4.0–10.5)
nRBC: 0 % (ref 0.0–0.2)

## 2019-06-19 MED ORDER — FLUOROURACIL CHEMO INJECTION 2.5 GM/50ML
400.0000 mg/m2 | Freq: Once | INTRAVENOUS | Status: AC
  Administered 2019-06-19: 650 mg via INTRAVENOUS
  Filled 2019-06-19: qty 13

## 2019-06-19 MED ORDER — HEPARIN SOD (PORK) LOCK FLUSH 100 UNIT/ML IV SOLN
500.0000 [IU] | Freq: Once | INTRAVENOUS | Status: DC | PRN
  Filled 2019-06-19: qty 5

## 2019-06-19 MED ORDER — PALONOSETRON HCL INJECTION 0.25 MG/5ML
0.2500 mg | Freq: Once | INTRAVENOUS | Status: AC
  Administered 2019-06-19: 0.25 mg via INTRAVENOUS

## 2019-06-19 MED ORDER — SODIUM CHLORIDE 0.9 % IV SOLN
2400.0000 mg/m2 | INTRAVENOUS | Status: DC
  Administered 2019-06-19: 4000 mg via INTRAVENOUS
  Filled 2019-06-19: qty 80

## 2019-06-19 MED ORDER — PALONOSETRON HCL INJECTION 0.25 MG/5ML
INTRAVENOUS | Status: AC
  Filled 2019-06-19: qty 5

## 2019-06-19 MED ORDER — SODIUM CHLORIDE 0.9 % IV SOLN
Freq: Once | INTRAVENOUS | Status: AC
  Filled 2019-06-19: qty 250

## 2019-06-19 MED ORDER — SODIUM CHLORIDE 0.9% FLUSH
10.0000 mL | INTRAVENOUS | Status: DC | PRN
  Filled 2019-06-19: qty 10

## 2019-06-19 MED ORDER — SODIUM CHLORIDE 0.9 % IV SOLN
5.0000 mg/kg | Freq: Once | INTRAVENOUS | Status: AC
  Administered 2019-06-19: 300 mg via INTRAVENOUS
  Filled 2019-06-19: qty 12

## 2019-06-19 MED ORDER — LEUCOVORIN CALCIUM INJECTION 350 MG
400.0000 mg/m2 | Freq: Once | INTRAVENOUS | Status: AC
  Administered 2019-06-19: 668 mg via INTRAVENOUS
  Filled 2019-06-19: qty 33.4

## 2019-06-19 NOTE — Patient Instructions (Signed)
[facility] Discharge Instructions for Patients Receiving Chemotherapy  Today you received the following chemotherapy agents Bevacizumab-Awwb, Leucovorin, 5FU  To help prevent nausea and vomiting after your treatment, we encourage you to take your nausea medication as directed  If you develop nausea and vomiting that is not controlled by your nausea medication, call the clinic.   BELOW ARE SYMPTOMS THAT SHOULD BE REPORTED IMMEDIATELY:  *FEVER GREATER THAN 100.5 F  *CHILLS WITH OR WITHOUT FEVER  NAUSEA AND VOMITING THAT IS NOT CONTROLLED WITH YOUR NAUSEA MEDICATION  *UNUSUAL SHORTNESS OF BREATH  *UNUSUAL BRUISING OR BLEEDING  TENDERNESS IN MOUTH AND THROAT WITH OR WITHOUT PRESENCE OF ULCERS  *URINARY PROBLEMS  *BOWEL PROBLEMS  UNUSUAL RASH Items with * indicate a potential emergency and should be followed up as soon as possible.  Feel free to call the clinic should you have any questions or concerns. The clinic phone number is [phone].  Please show the [place] at check-in to the Emergency Department and triage nurse.

## 2019-06-20 ENCOUNTER — Ambulatory Visit: Payer: 59

## 2019-06-20 ENCOUNTER — Other Ambulatory Visit: Payer: 59

## 2019-06-20 ENCOUNTER — Ambulatory Visit: Payer: 59 | Admitting: Hematology & Oncology

## 2019-06-21 ENCOUNTER — Other Ambulatory Visit: Payer: Self-pay

## 2019-06-21 ENCOUNTER — Inpatient Hospital Stay: Payer: 59

## 2019-06-21 VITALS — BP 116/76 | HR 83 | Temp 97.3°F | Resp 17

## 2019-06-21 DIAGNOSIS — C20 Malignant neoplasm of rectum: Secondary | ICD-10-CM

## 2019-06-21 DIAGNOSIS — Z5111 Encounter for antineoplastic chemotherapy: Secondary | ICD-10-CM | POA: Diagnosis not present

## 2019-06-21 DIAGNOSIS — Z5112 Encounter for antineoplastic immunotherapy: Secondary | ICD-10-CM | POA: Diagnosis not present

## 2019-06-21 MED ORDER — HEPARIN SOD (PORK) LOCK FLUSH 100 UNIT/ML IV SOLN
500.0000 [IU] | Freq: Once | INTRAVENOUS | Status: AC | PRN
  Administered 2019-06-21: 500 [IU]
  Filled 2019-06-21: qty 5

## 2019-06-21 MED ORDER — SODIUM CHLORIDE 0.9% FLUSH
10.0000 mL | INTRAVENOUS | Status: DC | PRN
  Administered 2019-06-21: 10 mL
  Filled 2019-06-21: qty 10

## 2019-06-26 ENCOUNTER — Other Ambulatory Visit: Payer: 59

## 2019-06-26 ENCOUNTER — Ambulatory Visit: Payer: 59

## 2019-07-03 ENCOUNTER — Inpatient Hospital Stay: Payer: 59

## 2019-07-03 ENCOUNTER — Inpatient Hospital Stay: Payer: 59 | Admitting: Family

## 2019-07-03 ENCOUNTER — Telehealth: Payer: Self-pay | Admitting: Hematology & Oncology

## 2019-07-03 NOTE — Telephone Encounter (Signed)
Faxed medical records to: [facility]  Address: [address] [place] [address]  P: [phone] F: [phone]

## 2019-07-04 ENCOUNTER — Ambulatory Visit: Payer: 59 | Admitting: Family

## 2019-07-04 ENCOUNTER — Other Ambulatory Visit: Payer: 59

## 2019-07-04 ENCOUNTER — Ambulatory Visit: Payer: 59

## 2019-07-05 ENCOUNTER — Inpatient Hospital Stay: Payer: 59

## 2019-07-05 ENCOUNTER — Encounter: Payer: Self-pay | Admitting: *Deleted

## 2019-07-05 NOTE — Progress Notes (Signed)
Patient has no follow up appointments scheduled.   Spoke to patient today. She have moved up [facility] for a few months and will be receiving treatment at [facility] in the interim. At this time her plan is to return to this area sometime in November.   Instructed patient to call this office a few weeks before her return so that we could schedule her follow up and treatments to keep her on schedule. She agreed.

## 2019-07-06 ENCOUNTER — Encounter: Payer: Self-pay | Admitting: *Deleted

## 2019-07-06 DIAGNOSIS — C787 Secondary malignant neoplasm of liver and intrahepatic bile duct: Secondary | ICD-10-CM | POA: Diagnosis not present

## 2019-07-06 DIAGNOSIS — C20 Malignant neoplasm of rectum: Secondary | ICD-10-CM | POA: Diagnosis not present

## 2019-07-06 NOTE — Progress Notes (Signed)
Patient called requesting that treatment summary be faxed to:  Dr Antonieta Iba [phone]  Chemotherapy flowsheet faxed per patient request.

## 2019-07-08 DIAGNOSIS — Z5112 Encounter for antineoplastic immunotherapy: Secondary | ICD-10-CM | POA: Diagnosis not present

## 2019-07-08 DIAGNOSIS — Z01818 Encounter for other preprocedural examination: Secondary | ICD-10-CM | POA: Diagnosis not present

## 2019-07-08 DIAGNOSIS — Z5111 Encounter for antineoplastic chemotherapy: Secondary | ICD-10-CM | POA: Diagnosis not present

## 2019-07-08 DIAGNOSIS — C787 Secondary malignant neoplasm of liver and intrahepatic bile duct: Secondary | ICD-10-CM | POA: Diagnosis not present

## 2019-07-08 DIAGNOSIS — C20 Malignant neoplasm of rectum: Secondary | ICD-10-CM | POA: Diagnosis not present

## 2019-07-18 DIAGNOSIS — C787 Secondary malignant neoplasm of liver and intrahepatic bile duct: Secondary | ICD-10-CM | POA: Diagnosis not present

## 2019-07-18 DIAGNOSIS — C20 Malignant neoplasm of rectum: Secondary | ICD-10-CM | POA: Diagnosis not present

## 2019-07-19 DIAGNOSIS — Z5111 Encounter for antineoplastic chemotherapy: Secondary | ICD-10-CM | POA: Diagnosis not present

## 2019-07-19 DIAGNOSIS — Z5112 Encounter for antineoplastic immunotherapy: Secondary | ICD-10-CM | POA: Diagnosis not present

## 2019-07-19 DIAGNOSIS — C20 Malignant neoplasm of rectum: Secondary | ICD-10-CM | POA: Diagnosis not present

## 2019-07-19 DIAGNOSIS — C787 Secondary malignant neoplasm of liver and intrahepatic bile duct: Secondary | ICD-10-CM | POA: Diagnosis not present

## 2019-08-01 DIAGNOSIS — Z5111 Encounter for antineoplastic chemotherapy: Secondary | ICD-10-CM | POA: Diagnosis not present

## 2019-08-01 DIAGNOSIS — C20 Malignant neoplasm of rectum: Secondary | ICD-10-CM | POA: Diagnosis not present

## 2019-08-01 DIAGNOSIS — C787 Secondary malignant neoplasm of liver and intrahepatic bile duct: Secondary | ICD-10-CM | POA: Diagnosis not present

## 2019-08-02 DIAGNOSIS — C20 Malignant neoplasm of rectum: Secondary | ICD-10-CM | POA: Diagnosis not present

## 2019-08-02 DIAGNOSIS — Z5112 Encounter for antineoplastic immunotherapy: Secondary | ICD-10-CM | POA: Diagnosis not present

## 2019-08-02 DIAGNOSIS — Z5111 Encounter for antineoplastic chemotherapy: Secondary | ICD-10-CM | POA: Diagnosis not present

## 2019-08-02 DIAGNOSIS — C787 Secondary malignant neoplasm of liver and intrahepatic bile duct: Secondary | ICD-10-CM | POA: Diagnosis not present

## 2019-08-16 DIAGNOSIS — C787 Secondary malignant neoplasm of liver and intrahepatic bile duct: Secondary | ICD-10-CM | POA: Diagnosis not present

## 2019-08-16 DIAGNOSIS — Z5112 Encounter for antineoplastic immunotherapy: Secondary | ICD-10-CM | POA: Diagnosis not present

## 2019-08-16 DIAGNOSIS — C20 Malignant neoplasm of rectum: Secondary | ICD-10-CM | POA: Diagnosis not present

## 2019-08-16 DIAGNOSIS — Z5111 Encounter for antineoplastic chemotherapy: Secondary | ICD-10-CM | POA: Diagnosis not present

## 2019-08-29 DIAGNOSIS — C20 Malignant neoplasm of rectum: Secondary | ICD-10-CM | POA: Diagnosis not present

## 2019-08-29 DIAGNOSIS — Z5111 Encounter for antineoplastic chemotherapy: Secondary | ICD-10-CM | POA: Diagnosis not present

## 2019-08-29 DIAGNOSIS — Z5112 Encounter for antineoplastic immunotherapy: Secondary | ICD-10-CM | POA: Diagnosis not present

## 2019-08-29 DIAGNOSIS — C787 Secondary malignant neoplasm of liver and intrahepatic bile duct: Secondary | ICD-10-CM | POA: Diagnosis not present

## 2019-08-30 ENCOUNTER — Telehealth: Payer: Self-pay | Admitting: Hematology & Oncology

## 2019-08-30 NOTE — Telephone Encounter (Signed)
Faxed 05/23/2019 ofc note to: [facility] F: [phone] P: [phone]     Nasya Cadden 02/06/1965 CN [id number]   COPY SCANNED

## 2019-09-07 DIAGNOSIS — C20 Malignant neoplasm of rectum: Secondary | ICD-10-CM | POA: Diagnosis not present

## 2019-09-07 DIAGNOSIS — Z808 Family history of malignant neoplasm of other organs or systems: Secondary | ICD-10-CM | POA: Diagnosis not present

## 2019-09-07 DIAGNOSIS — R197 Diarrhea, unspecified: Secondary | ICD-10-CM | POA: Diagnosis not present

## 2019-09-07 DIAGNOSIS — R109 Unspecified abdominal pain: Secondary | ICD-10-CM | POA: Diagnosis not present

## 2019-09-07 DIAGNOSIS — Z79899 Other long term (current) drug therapy: Secondary | ICD-10-CM | POA: Diagnosis not present

## 2019-09-07 DIAGNOSIS — E860 Dehydration: Secondary | ICD-10-CM | POA: Diagnosis not present

## 2019-09-07 DIAGNOSIS — C787 Secondary malignant neoplasm of liver and intrahepatic bile duct: Secondary | ICD-10-CM | POA: Diagnosis not present

## 2019-09-07 DIAGNOSIS — C19 Malignant neoplasm of rectosigmoid junction: Secondary | ICD-10-CM | POA: Diagnosis not present

## 2019-09-12 DIAGNOSIS — C787 Secondary malignant neoplasm of liver and intrahepatic bile duct: Secondary | ICD-10-CM | POA: Diagnosis not present

## 2019-09-12 DIAGNOSIS — C20 Malignant neoplasm of rectum: Secondary | ICD-10-CM | POA: Diagnosis not present

## 2019-09-13 DIAGNOSIS — Z5111 Encounter for antineoplastic chemotherapy: Secondary | ICD-10-CM | POA: Diagnosis not present

## 2019-09-13 DIAGNOSIS — Z5112 Encounter for antineoplastic immunotherapy: Secondary | ICD-10-CM | POA: Diagnosis not present

## 2019-09-13 DIAGNOSIS — C787 Secondary malignant neoplasm of liver and intrahepatic bile duct: Secondary | ICD-10-CM | POA: Diagnosis not present

## 2019-09-13 DIAGNOSIS — C20 Malignant neoplasm of rectum: Secondary | ICD-10-CM | POA: Diagnosis not present

## 2019-09-14 DIAGNOSIS — E1165 Type 2 diabetes mellitus with hyperglycemia: Secondary | ICD-10-CM | POA: Diagnosis not present

## 2019-09-18 DIAGNOSIS — Z20828 Contact with and (suspected) exposure to other viral communicable diseases: Secondary | ICD-10-CM | POA: Diagnosis not present

## 2019-09-18 DIAGNOSIS — C787 Secondary malignant neoplasm of liver and intrahepatic bile duct: Secondary | ICD-10-CM | POA: Diagnosis not present

## 2019-09-18 DIAGNOSIS — C801 Malignant (primary) neoplasm, unspecified: Secondary | ICD-10-CM | POA: Diagnosis not present

## 2019-09-18 DIAGNOSIS — K51511 Left sided colitis with rectal bleeding: Secondary | ICD-10-CM | POA: Diagnosis not present

## 2019-09-18 DIAGNOSIS — K922 Gastrointestinal hemorrhage, unspecified: Secondary | ICD-10-CM | POA: Diagnosis not present

## 2019-09-18 DIAGNOSIS — K529 Noninfective gastroenteritis and colitis, unspecified: Secondary | ICD-10-CM | POA: Diagnosis not present

## 2019-09-18 DIAGNOSIS — Z20822 Contact with and (suspected) exposure to covid-19: Secondary | ICD-10-CM | POA: Diagnosis not present

## 2019-09-26 DIAGNOSIS — C20 Malignant neoplasm of rectum: Secondary | ICD-10-CM | POA: Diagnosis not present

## 2019-09-26 DIAGNOSIS — C787 Secondary malignant neoplasm of liver and intrahepatic bile duct: Secondary | ICD-10-CM | POA: Diagnosis not present

## 2019-09-27 DIAGNOSIS — Z5112 Encounter for antineoplastic immunotherapy: Secondary | ICD-10-CM | POA: Diagnosis not present

## 2019-09-27 DIAGNOSIS — Z5111 Encounter for antineoplastic chemotherapy: Secondary | ICD-10-CM | POA: Diagnosis not present

## 2019-09-27 DIAGNOSIS — C787 Secondary malignant neoplasm of liver and intrahepatic bile duct: Secondary | ICD-10-CM | POA: Diagnosis not present

## 2019-09-27 DIAGNOSIS — C20 Malignant neoplasm of rectum: Secondary | ICD-10-CM | POA: Diagnosis not present

## 2019-10-05 DIAGNOSIS — C787 Secondary malignant neoplasm of liver and intrahepatic bile duct: Secondary | ICD-10-CM | POA: Diagnosis not present

## 2019-10-05 DIAGNOSIS — C20 Malignant neoplasm of rectum: Secondary | ICD-10-CM | POA: Diagnosis not present

## 2019-10-05 DIAGNOSIS — Z79899 Other long term (current) drug therapy: Secondary | ICD-10-CM | POA: Diagnosis not present

## 2019-10-05 DIAGNOSIS — C7802 Secondary malignant neoplasm of left lung: Secondary | ICD-10-CM | POA: Diagnosis not present

## 2019-10-05 DIAGNOSIS — Z8719 Personal history of other diseases of the digestive system: Secondary | ICD-10-CM | POA: Diagnosis not present

## 2019-10-05 DIAGNOSIS — C7801 Secondary malignant neoplasm of right lung: Secondary | ICD-10-CM | POA: Diagnosis not present

## 2019-10-05 DIAGNOSIS — K6289 Other specified diseases of anus and rectum: Secondary | ICD-10-CM | POA: Diagnosis not present

## 2019-10-09 DIAGNOSIS — Z79899 Other long term (current) drug therapy: Secondary | ICD-10-CM | POA: Diagnosis not present

## 2019-10-09 DIAGNOSIS — R599 Enlarged lymph nodes, unspecified: Secondary | ICD-10-CM | POA: Diagnosis not present

## 2019-10-09 DIAGNOSIS — C787 Secondary malignant neoplasm of liver and intrahepatic bile duct: Secondary | ICD-10-CM | POA: Diagnosis not present

## 2019-10-09 DIAGNOSIS — C20 Malignant neoplasm of rectum: Secondary | ICD-10-CM | POA: Diagnosis not present

## 2019-10-09 DIAGNOSIS — Z808 Family history of malignant neoplasm of other organs or systems: Secondary | ICD-10-CM | POA: Diagnosis not present

## 2019-10-10 DIAGNOSIS — C787 Secondary malignant neoplasm of liver and intrahepatic bile duct: Secondary | ICD-10-CM | POA: Diagnosis not present

## 2019-10-10 DIAGNOSIS — C20 Malignant neoplasm of rectum: Secondary | ICD-10-CM | POA: Diagnosis not present

## 2019-10-11 DIAGNOSIS — C20 Malignant neoplasm of rectum: Secondary | ICD-10-CM | POA: Diagnosis not present

## 2019-10-11 DIAGNOSIS — C7802 Secondary malignant neoplasm of left lung: Secondary | ICD-10-CM | POA: Diagnosis not present

## 2019-10-11 DIAGNOSIS — C7801 Secondary malignant neoplasm of right lung: Secondary | ICD-10-CM | POA: Diagnosis not present

## 2019-10-11 DIAGNOSIS — Z5111 Encounter for antineoplastic chemotherapy: Secondary | ICD-10-CM | POA: Diagnosis not present

## 2019-10-11 DIAGNOSIS — C787 Secondary malignant neoplasm of liver and intrahepatic bile duct: Secondary | ICD-10-CM | POA: Diagnosis not present

## 2019-10-13 DIAGNOSIS — C20 Malignant neoplasm of rectum: Secondary | ICD-10-CM | POA: Diagnosis not present

## 2019-10-13 DIAGNOSIS — C787 Secondary malignant neoplasm of liver and intrahepatic bile duct: Secondary | ICD-10-CM | POA: Diagnosis not present

## 2019-10-13 DIAGNOSIS — Z79899 Other long term (current) drug therapy: Secondary | ICD-10-CM | POA: Diagnosis not present

## 2019-10-13 DIAGNOSIS — Z808 Family history of malignant neoplasm of other organs or systems: Secondary | ICD-10-CM | POA: Diagnosis not present

## 2019-10-17 ENCOUNTER — Other Ambulatory Visit: Payer: Self-pay | Admitting: Family

## 2019-10-25 DIAGNOSIS — C20 Malignant neoplasm of rectum: Secondary | ICD-10-CM | POA: Diagnosis not present

## 2019-10-25 DIAGNOSIS — C7802 Secondary malignant neoplasm of left lung: Secondary | ICD-10-CM | POA: Diagnosis not present

## 2019-10-25 DIAGNOSIS — C787 Secondary malignant neoplasm of liver and intrahepatic bile duct: Secondary | ICD-10-CM | POA: Diagnosis not present

## 2019-10-25 DIAGNOSIS — Z5111 Encounter for antineoplastic chemotherapy: Secondary | ICD-10-CM | POA: Diagnosis not present

## 2019-10-25 DIAGNOSIS — C7801 Secondary malignant neoplasm of right lung: Secondary | ICD-10-CM | POA: Diagnosis not present

## 2019-11-08 DIAGNOSIS — C7802 Secondary malignant neoplasm of left lung: Secondary | ICD-10-CM | POA: Diagnosis not present

## 2019-11-08 DIAGNOSIS — C20 Malignant neoplasm of rectum: Secondary | ICD-10-CM | POA: Diagnosis not present

## 2019-11-08 DIAGNOSIS — Z5111 Encounter for antineoplastic chemotherapy: Secondary | ICD-10-CM | POA: Diagnosis not present

## 2019-11-08 DIAGNOSIS — C787 Secondary malignant neoplasm of liver and intrahepatic bile duct: Secondary | ICD-10-CM | POA: Diagnosis not present

## 2019-11-08 DIAGNOSIS — C7801 Secondary malignant neoplasm of right lung: Secondary | ICD-10-CM | POA: Diagnosis not present

## 2019-11-10 DIAGNOSIS — C787 Secondary malignant neoplasm of liver and intrahepatic bile duct: Secondary | ICD-10-CM | POA: Diagnosis not present

## 2019-11-10 DIAGNOSIS — C7802 Secondary malignant neoplasm of left lung: Secondary | ICD-10-CM | POA: Diagnosis not present

## 2019-11-10 DIAGNOSIS — Z418 Encounter for other procedures for purposes other than remedying health state: Secondary | ICD-10-CM | POA: Diagnosis not present

## 2019-11-10 DIAGNOSIS — Z79899 Other long term (current) drug therapy: Secondary | ICD-10-CM | POA: Diagnosis not present

## 2019-11-10 DIAGNOSIS — C7801 Secondary malignant neoplasm of right lung: Secondary | ICD-10-CM | POA: Diagnosis not present

## 2019-11-10 DIAGNOSIS — C20 Malignant neoplasm of rectum: Secondary | ICD-10-CM | POA: Diagnosis not present

## 2019-11-20 DIAGNOSIS — C7802 Secondary malignant neoplasm of left lung: Secondary | ICD-10-CM | POA: Diagnosis not present

## 2019-11-20 DIAGNOSIS — Z5111 Encounter for antineoplastic chemotherapy: Secondary | ICD-10-CM | POA: Diagnosis not present

## 2019-11-20 DIAGNOSIS — C20 Malignant neoplasm of rectum: Secondary | ICD-10-CM | POA: Diagnosis not present

## 2019-11-20 DIAGNOSIS — C7801 Secondary malignant neoplasm of right lung: Secondary | ICD-10-CM | POA: Diagnosis not present

## 2019-11-20 DIAGNOSIS — C787 Secondary malignant neoplasm of liver and intrahepatic bile duct: Secondary | ICD-10-CM | POA: Diagnosis not present

## 2019-11-22 DIAGNOSIS — Z418 Encounter for other procedures for purposes other than remedying health state: Secondary | ICD-10-CM | POA: Diagnosis not present

## 2019-12-08 DIAGNOSIS — C787 Secondary malignant neoplasm of liver and intrahepatic bile duct: Secondary | ICD-10-CM | POA: Diagnosis not present

## 2019-12-08 DIAGNOSIS — C7802 Secondary malignant neoplasm of left lung: Secondary | ICD-10-CM | POA: Diagnosis not present

## 2019-12-08 DIAGNOSIS — Z5111 Encounter for antineoplastic chemotherapy: Secondary | ICD-10-CM | POA: Diagnosis not present

## 2019-12-08 DIAGNOSIS — C7801 Secondary malignant neoplasm of right lung: Secondary | ICD-10-CM | POA: Diagnosis not present

## 2019-12-08 DIAGNOSIS — C20 Malignant neoplasm of rectum: Secondary | ICD-10-CM | POA: Diagnosis not present

## 2019-12-11 DIAGNOSIS — C7802 Secondary malignant neoplasm of left lung: Secondary | ICD-10-CM | POA: Diagnosis not present

## 2019-12-11 DIAGNOSIS — C787 Secondary malignant neoplasm of liver and intrahepatic bile duct: Secondary | ICD-10-CM | POA: Diagnosis not present

## 2019-12-11 DIAGNOSIS — C20 Malignant neoplasm of rectum: Secondary | ICD-10-CM | POA: Diagnosis not present

## 2019-12-11 DIAGNOSIS — Z79899 Other long term (current) drug therapy: Secondary | ICD-10-CM | POA: Diagnosis not present

## 2019-12-11 DIAGNOSIS — C7801 Secondary malignant neoplasm of right lung: Secondary | ICD-10-CM | POA: Diagnosis not present

## 2019-12-11 DIAGNOSIS — Z418 Encounter for other procedures for purposes other than remedying health state: Secondary | ICD-10-CM | POA: Diagnosis not present

## 2019-12-11 DIAGNOSIS — Z5111 Encounter for antineoplastic chemotherapy: Secondary | ICD-10-CM | POA: Diagnosis not present

## 2019-12-16 DIAGNOSIS — G43009 Migraine without aura, not intractable, without status migrainosus: Secondary | ICD-10-CM | POA: Diagnosis not present

## 2019-12-17 DIAGNOSIS — S300XXA Contusion of lower back and pelvis, initial encounter: Secondary | ICD-10-CM | POA: Diagnosis not present

## 2019-12-17 DIAGNOSIS — Y929 Unspecified place or not applicable: Secondary | ICD-10-CM | POA: Diagnosis not present

## 2019-12-17 DIAGNOSIS — Y9352 Activity, horseback riding: Secondary | ICD-10-CM | POA: Diagnosis not present

## 2019-12-18 DIAGNOSIS — R197 Diarrhea, unspecified: Secondary | ICD-10-CM | POA: Diagnosis not present

## 2019-12-18 DIAGNOSIS — R350 Frequency of micturition: Secondary | ICD-10-CM | POA: Diagnosis not present

## 2019-12-18 DIAGNOSIS — F324 Major depressive disorder, single episode, in partial remission: Secondary | ICD-10-CM | POA: Diagnosis not present

## 2019-12-18 DIAGNOSIS — F419 Anxiety disorder, unspecified: Secondary | ICD-10-CM | POA: Diagnosis not present

## 2019-12-18 DIAGNOSIS — Z87798 Personal history of other (corrected) congenital malformations: Secondary | ICD-10-CM | POA: Diagnosis not present

## 2019-12-18 DIAGNOSIS — R110 Nausea: Secondary | ICD-10-CM | POA: Diagnosis not present

## 2019-12-18 DIAGNOSIS — R109 Unspecified abdominal pain: Secondary | ICD-10-CM | POA: Diagnosis not present

## 2019-12-18 DIAGNOSIS — J45909 Unspecified asthma, uncomplicated: Secondary | ICD-10-CM | POA: Diagnosis not present

## 2019-12-19 DIAGNOSIS — E119 Type 2 diabetes mellitus without complications: Secondary | ICD-10-CM | POA: Diagnosis not present

## 2019-12-19 DIAGNOSIS — K219 Gastro-esophageal reflux disease without esophagitis: Secondary | ICD-10-CM | POA: Diagnosis not present

## 2019-12-19 DIAGNOSIS — E785 Hyperlipidemia, unspecified: Secondary | ICD-10-CM | POA: Diagnosis not present

## 2019-12-19 DIAGNOSIS — Z131 Encounter for screening for diabetes mellitus: Secondary | ICD-10-CM | POA: Diagnosis not present

## 2019-12-19 DIAGNOSIS — E559 Vitamin D deficiency, unspecified: Secondary | ICD-10-CM | POA: Diagnosis not present

## 2019-12-19 DIAGNOSIS — I10 Essential (primary) hypertension: Secondary | ICD-10-CM | POA: Diagnosis not present

## 2019-12-19 DIAGNOSIS — E7800 Pure hypercholesterolemia, unspecified: Secondary | ICD-10-CM | POA: Diagnosis not present

## 2019-12-21 DIAGNOSIS — R5081 Fever presenting with conditions classified elsewhere: Secondary | ICD-10-CM | POA: Diagnosis not present

## 2019-12-21 DIAGNOSIS — C20 Malignant neoplasm of rectum: Secondary | ICD-10-CM | POA: Diagnosis not present

## 2019-12-21 DIAGNOSIS — C7802 Secondary malignant neoplasm of left lung: Secondary | ICD-10-CM | POA: Diagnosis not present

## 2019-12-21 DIAGNOSIS — C7801 Secondary malignant neoplasm of right lung: Secondary | ICD-10-CM | POA: Diagnosis not present

## 2019-12-21 DIAGNOSIS — C787 Secondary malignant neoplasm of liver and intrahepatic bile duct: Secondary | ICD-10-CM | POA: Diagnosis not present

## 2019-12-21 DIAGNOSIS — Z5111 Encounter for antineoplastic chemotherapy: Secondary | ICD-10-CM | POA: Diagnosis not present

## 2019-12-21 DIAGNOSIS — M791 Myalgia, unspecified site: Secondary | ICD-10-CM | POA: Diagnosis not present

## 2019-12-22 DIAGNOSIS — D509 Iron deficiency anemia, unspecified: Secondary | ICD-10-CM | POA: Diagnosis not present

## 2019-12-22 DIAGNOSIS — K912 Postsurgical malabsorption, not elsewhere classified: Secondary | ICD-10-CM | POA: Diagnosis not present

## 2019-12-22 DIAGNOSIS — E559 Vitamin D deficiency, unspecified: Secondary | ICD-10-CM | POA: Diagnosis not present

## 2019-12-22 DIAGNOSIS — E539 Vitamin B deficiency, unspecified: Secondary | ICD-10-CM | POA: Diagnosis not present

## 2019-12-23 DIAGNOSIS — C7801 Secondary malignant neoplasm of right lung: Secondary | ICD-10-CM | POA: Diagnosis not present

## 2019-12-23 DIAGNOSIS — C7802 Secondary malignant neoplasm of left lung: Secondary | ICD-10-CM | POA: Diagnosis not present

## 2019-12-23 DIAGNOSIS — Z418 Encounter for other procedures for purposes other than remedying health state: Secondary | ICD-10-CM | POA: Diagnosis not present

## 2019-12-23 DIAGNOSIS — Z79899 Other long term (current) drug therapy: Secondary | ICD-10-CM | POA: Diagnosis not present

## 2019-12-23 DIAGNOSIS — C787 Secondary malignant neoplasm of liver and intrahepatic bile duct: Secondary | ICD-10-CM | POA: Diagnosis not present

## 2019-12-23 DIAGNOSIS — C20 Malignant neoplasm of rectum: Secondary | ICD-10-CM | POA: Diagnosis not present

## 2019-12-26 DIAGNOSIS — C787 Secondary malignant neoplasm of liver and intrahepatic bile duct: Secondary | ICD-10-CM | POA: Diagnosis not present

## 2019-12-26 DIAGNOSIS — R918 Other nonspecific abnormal finding of lung field: Secondary | ICD-10-CM | POA: Diagnosis not present

## 2019-12-26 DIAGNOSIS — C20 Malignant neoplasm of rectum: Secondary | ICD-10-CM | POA: Diagnosis not present

## 2019-12-28 DIAGNOSIS — C19 Malignant neoplasm of rectosigmoid junction: Secondary | ICD-10-CM | POA: Diagnosis not present

## 2019-12-28 DIAGNOSIS — Z5111 Encounter for antineoplastic chemotherapy: Secondary | ICD-10-CM | POA: Diagnosis not present

## 2019-12-28 DIAGNOSIS — C7802 Secondary malignant neoplasm of left lung: Secondary | ICD-10-CM | POA: Diagnosis not present

## 2019-12-28 DIAGNOSIS — C7989 Secondary malignant neoplasm of other specified sites: Secondary | ICD-10-CM | POA: Diagnosis not present

## 2019-12-28 DIAGNOSIS — K6289 Other specified diseases of anus and rectum: Secondary | ICD-10-CM | POA: Diagnosis not present

## 2019-12-28 DIAGNOSIS — C787 Secondary malignant neoplasm of liver and intrahepatic bile duct: Secondary | ICD-10-CM | POA: Diagnosis not present

## 2019-12-28 DIAGNOSIS — C7801 Secondary malignant neoplasm of right lung: Secondary | ICD-10-CM | POA: Diagnosis not present

## 2019-12-28 DIAGNOSIS — Z79899 Other long term (current) drug therapy: Secondary | ICD-10-CM | POA: Diagnosis not present

## 2019-12-28 DIAGNOSIS — C20 Malignant neoplasm of rectum: Secondary | ICD-10-CM | POA: Diagnosis not present

## 2019-12-28 DIAGNOSIS — Z7189 Other specified counseling: Secondary | ICD-10-CM | POA: Diagnosis not present

## 2019-12-29 DIAGNOSIS — C787 Secondary malignant neoplasm of liver and intrahepatic bile duct: Secondary | ICD-10-CM | POA: Diagnosis not present

## 2019-12-29 DIAGNOSIS — C19 Malignant neoplasm of rectosigmoid junction: Secondary | ICD-10-CM | POA: Diagnosis not present

## 2020-01-04 DIAGNOSIS — C7801 Secondary malignant neoplasm of right lung: Secondary | ICD-10-CM | POA: Diagnosis not present

## 2020-01-04 DIAGNOSIS — Z01818 Encounter for other preprocedural examination: Secondary | ICD-10-CM | POA: Diagnosis not present

## 2020-01-04 DIAGNOSIS — R918 Other nonspecific abnormal finding of lung field: Secondary | ICD-10-CM | POA: Diagnosis not present

## 2020-01-04 DIAGNOSIS — C19 Malignant neoplasm of rectosigmoid junction: Secondary | ICD-10-CM | POA: Diagnosis not present

## 2020-01-04 DIAGNOSIS — Z79899 Other long term (current) drug therapy: Secondary | ICD-10-CM | POA: Diagnosis not present

## 2020-01-04 DIAGNOSIS — C787 Secondary malignant neoplasm of liver and intrahepatic bile duct: Secondary | ICD-10-CM | POA: Diagnosis not present

## 2020-01-04 DIAGNOSIS — C7802 Secondary malignant neoplasm of left lung: Secondary | ICD-10-CM | POA: Diagnosis not present

## 2020-01-04 DIAGNOSIS — C20 Malignant neoplasm of rectum: Secondary | ICD-10-CM | POA: Diagnosis not present

## 2020-01-10 DIAGNOSIS — Z23 Encounter for immunization: Secondary | ICD-10-CM | POA: Diagnosis not present

## 2020-01-16 DIAGNOSIS — Z808 Family history of malignant neoplasm of other organs or systems: Secondary | ICD-10-CM | POA: Diagnosis not present

## 2020-01-16 DIAGNOSIS — C787 Secondary malignant neoplasm of liver and intrahepatic bile duct: Secondary | ICD-10-CM | POA: Diagnosis not present

## 2020-01-16 DIAGNOSIS — C7989 Secondary malignant neoplasm of other specified sites: Secondary | ICD-10-CM | POA: Diagnosis not present

## 2020-01-16 DIAGNOSIS — R0781 Pleurodynia: Secondary | ICD-10-CM | POA: Diagnosis not present

## 2020-01-16 DIAGNOSIS — C20 Malignant neoplasm of rectum: Secondary | ICD-10-CM | POA: Diagnosis not present

## 2020-01-16 DIAGNOSIS — R071 Chest pain on breathing: Secondary | ICD-10-CM | POA: Diagnosis not present

## 2020-01-16 DIAGNOSIS — C19 Malignant neoplasm of rectosigmoid junction: Secondary | ICD-10-CM | POA: Diagnosis not present

## 2020-01-16 DIAGNOSIS — Z79899 Other long term (current) drug therapy: Secondary | ICD-10-CM | POA: Diagnosis not present

## 2020-01-18 DIAGNOSIS — C7989 Secondary malignant neoplasm of other specified sites: Secondary | ICD-10-CM | POA: Diagnosis not present

## 2020-01-18 DIAGNOSIS — C787 Secondary malignant neoplasm of liver and intrahepatic bile duct: Secondary | ICD-10-CM | POA: Diagnosis not present

## 2020-01-18 DIAGNOSIS — C20 Malignant neoplasm of rectum: Secondary | ICD-10-CM | POA: Diagnosis not present

## 2020-01-18 DIAGNOSIS — Z79899 Other long term (current) drug therapy: Secondary | ICD-10-CM | POA: Diagnosis not present

## 2020-01-18 DIAGNOSIS — Z01810 Encounter for preprocedural cardiovascular examination: Secondary | ICD-10-CM | POA: Diagnosis not present

## 2020-01-18 DIAGNOSIS — C19 Malignant neoplasm of rectosigmoid junction: Secondary | ICD-10-CM | POA: Diagnosis not present

## 2020-01-22 DIAGNOSIS — Z01810 Encounter for preprocedural cardiovascular examination: Secondary | ICD-10-CM | POA: Diagnosis not present

## 2020-01-23 DIAGNOSIS — Z01810 Encounter for preprocedural cardiovascular examination: Secondary | ICD-10-CM | POA: Diagnosis not present

## 2020-01-24 DIAGNOSIS — Z808 Family history of malignant neoplasm of other organs or systems: Secondary | ICD-10-CM | POA: Diagnosis not present

## 2020-01-24 DIAGNOSIS — C7989 Secondary malignant neoplasm of other specified sites: Secondary | ICD-10-CM | POA: Diagnosis not present

## 2020-01-24 DIAGNOSIS — C20 Malignant neoplasm of rectum: Secondary | ICD-10-CM | POA: Diagnosis not present

## 2020-01-24 DIAGNOSIS — Z79899 Other long term (current) drug therapy: Secondary | ICD-10-CM | POA: Diagnosis not present

## 2020-01-24 DIAGNOSIS — R9431 Abnormal electrocardiogram [ECG] [EKG]: Secondary | ICD-10-CM | POA: Diagnosis not present

## 2020-01-24 DIAGNOSIS — C787 Secondary malignant neoplasm of liver and intrahepatic bile duct: Secondary | ICD-10-CM | POA: Diagnosis not present

## 2020-01-24 DIAGNOSIS — Z01810 Encounter for preprocedural cardiovascular examination: Secondary | ICD-10-CM | POA: Diagnosis not present

## 2020-01-24 DIAGNOSIS — Z01812 Encounter for preprocedural laboratory examination: Secondary | ICD-10-CM | POA: Diagnosis not present

## 2020-01-26 DIAGNOSIS — T451X5A Adverse effect of antineoplastic and immunosuppressive drugs, initial encounter: Secondary | ICD-10-CM | POA: Diagnosis not present

## 2020-01-26 DIAGNOSIS — C7801 Secondary malignant neoplasm of right lung: Secondary | ICD-10-CM | POA: Diagnosis not present

## 2020-01-26 DIAGNOSIS — G620 Drug-induced polyneuropathy: Secondary | ICD-10-CM | POA: Diagnosis not present

## 2020-01-26 DIAGNOSIS — C787 Secondary malignant neoplasm of liver and intrahepatic bile duct: Secondary | ICD-10-CM | POA: Diagnosis not present

## 2020-01-26 DIAGNOSIS — Z538 Procedure and treatment not carried out for other reasons: Secondary | ICD-10-CM | POA: Diagnosis not present

## 2020-01-26 DIAGNOSIS — C20 Malignant neoplasm of rectum: Secondary | ICD-10-CM | POA: Diagnosis not present

## 2020-01-26 DIAGNOSIS — C7989 Secondary malignant neoplasm of other specified sites: Secondary | ICD-10-CM | POA: Diagnosis not present

## 2020-01-26 DIAGNOSIS — C19 Malignant neoplasm of rectosigmoid junction: Secondary | ICD-10-CM | POA: Diagnosis not present

## 2020-01-26 DIAGNOSIS — C786 Secondary malignant neoplasm of retroperitoneum and peritoneum: Secondary | ICD-10-CM | POA: Diagnosis not present

## 2020-01-26 DIAGNOSIS — K6289 Other specified diseases of anus and rectum: Secondary | ICD-10-CM | POA: Diagnosis not present

## 2020-01-31 DIAGNOSIS — C189 Malignant neoplasm of colon, unspecified: Secondary | ICD-10-CM | POA: Diagnosis not present

## 2020-01-31 DIAGNOSIS — Z483 Aftercare following surgery for neoplasm: Secondary | ICD-10-CM | POA: Diagnosis not present

## 2020-02-01 DIAGNOSIS — Z483 Aftercare following surgery for neoplasm: Secondary | ICD-10-CM | POA: Diagnosis not present

## 2020-02-01 DIAGNOSIS — C189 Malignant neoplasm of colon, unspecified: Secondary | ICD-10-CM | POA: Diagnosis not present

## 2020-02-04 DIAGNOSIS — Z483 Aftercare following surgery for neoplasm: Secondary | ICD-10-CM | POA: Diagnosis not present

## 2020-02-04 DIAGNOSIS — C189 Malignant neoplasm of colon, unspecified: Secondary | ICD-10-CM | POA: Diagnosis not present

## 2020-02-06 ENCOUNTER — Encounter: Payer: Self-pay | Admitting: *Deleted

## 2020-02-06 ENCOUNTER — Other Ambulatory Visit: Payer: Self-pay | Admitting: *Deleted

## 2020-02-06 DIAGNOSIS — Z933 Colostomy status: Secondary | ICD-10-CM | POA: Insufficient documentation

## 2020-02-06 NOTE — Patient Outreach (Signed)
[facility]) Care Management  02/06/2020  Sharon Harris [birth year] [id number]   Transition of care call/case closure   Referral received:01/26/19 Initial outreach:02/06/19 Insurance: [employer]   Subjective: Initial successful telephone call to patient's preferred number in order to complete transition of care assessment; 2 HIPAA identifiers verified. Explained purpose of call and completed transition of care assessment.  Shawnae states she is doing well, denies post-operative problems, says surgical incisions are unremarkable, states surgical pain well managed with prescribed medications. She reports that she has not had pain to the point of needing pain medication. She reports having colostomy in place she is managing well and with support of home health RN twice weekly. She reports  tolerating diet well low fiber diet. denies bowel or bladder problems.  Spouse/children are assisting with her recovery, patient reports that she moved to [place] to be closer to family and for continued treatments. She discussed returning to chemo therapy after surgeon and oncologist clearance.  She reports tolerating mobility in home and taking up to 2 mile walks .   Reviewed accessing the following [place] Benefits :   She discussed being on disability with paying [employer], she is unsure if she has hospital indemnity or if eligible now , agreeable to having contact number for follow up, provided contact number for [employer].    Objective:  Per patient report and inbasket referral Mrs.Funez  was hospitalized at [facility] 1/14-1/17/22 for Colon surgery  , colostomy. No information in Care everywhere PMX: include: Metastatic  Rectal Adenocarcinoma  Liver lung metastasis, Chemotherapy  She was discharged to home on 01/29/20 with home health RN  Services without need of  DME.   Assessment:  Patient voices good understanding of all discharge instructions.  See transition of care flowsheet for  assessment details.   Plan:  Reviewed hospital discharge diagnosis of colon surgery, colostomy   and discharge treatment plan using hospital discharge instructions, assessing medication adherence, reviewing problems requiring provider notification, and discussing the importance of follow up with surgeon, primary care provider and/or specialists as directed.  No ongoing care management needs identified so will close case to [facility] services and route successful outreach letter with [facility] Management pamphlet and 24 Hour Nurse Line Magnet to [facility] clinical pool to be mailed to patient's home address.    Joylene Draft, RN, BSN  [place] Management Coordinator  [phone]- Mobile [phone]- Toll Free Main Office

## 2020-02-08 DIAGNOSIS — C7801 Secondary malignant neoplasm of right lung: Secondary | ICD-10-CM | POA: Diagnosis not present

## 2020-02-08 DIAGNOSIS — C19 Malignant neoplasm of rectosigmoid junction: Secondary | ICD-10-CM | POA: Diagnosis not present

## 2020-02-08 DIAGNOSIS — C786 Secondary malignant neoplasm of retroperitoneum and peritoneum: Secondary | ICD-10-CM | POA: Diagnosis not present

## 2020-02-08 DIAGNOSIS — C787 Secondary malignant neoplasm of liver and intrahepatic bile duct: Secondary | ICD-10-CM | POA: Diagnosis not present

## 2020-02-08 DIAGNOSIS — C7989 Secondary malignant neoplasm of other specified sites: Secondary | ICD-10-CM | POA: Diagnosis not present

## 2020-02-09 DIAGNOSIS — C189 Malignant neoplasm of colon, unspecified: Secondary | ICD-10-CM | POA: Diagnosis not present

## 2020-02-09 DIAGNOSIS — Z483 Aftercare following surgery for neoplasm: Secondary | ICD-10-CM | POA: Diagnosis not present

## 2020-02-13 DIAGNOSIS — C787 Secondary malignant neoplasm of liver and intrahepatic bile duct: Secondary | ICD-10-CM | POA: Diagnosis not present

## 2020-02-13 DIAGNOSIS — C20 Malignant neoplasm of rectum: Secondary | ICD-10-CM | POA: Diagnosis not present

## 2020-02-13 DIAGNOSIS — C786 Secondary malignant neoplasm of retroperitoneum and peritoneum: Secondary | ICD-10-CM | POA: Diagnosis not present

## 2020-02-13 DIAGNOSIS — Z5111 Encounter for antineoplastic chemotherapy: Secondary | ICD-10-CM | POA: Diagnosis not present

## 2020-02-13 DIAGNOSIS — Z933 Colostomy status: Secondary | ICD-10-CM | POA: Diagnosis not present

## 2020-02-14 DIAGNOSIS — C787 Secondary malignant neoplasm of liver and intrahepatic bile duct: Secondary | ICD-10-CM | POA: Diagnosis not present

## 2020-02-14 DIAGNOSIS — C20 Malignant neoplasm of rectum: Secondary | ICD-10-CM | POA: Diagnosis not present

## 2020-02-14 DIAGNOSIS — Z5111 Encounter for antineoplastic chemotherapy: Secondary | ICD-10-CM | POA: Diagnosis not present

## 2020-02-14 DIAGNOSIS — C7989 Secondary malignant neoplasm of other specified sites: Secondary | ICD-10-CM | POA: Diagnosis not present

## 2020-02-15 DIAGNOSIS — C189 Malignant neoplasm of colon, unspecified: Secondary | ICD-10-CM | POA: Diagnosis not present

## 2020-02-15 DIAGNOSIS — Z483 Aftercare following surgery for neoplasm: Secondary | ICD-10-CM | POA: Diagnosis not present

## 2020-02-16 DIAGNOSIS — C7801 Secondary malignant neoplasm of right lung: Secondary | ICD-10-CM | POA: Diagnosis not present

## 2020-02-16 DIAGNOSIS — Z932 Ileostomy status: Secondary | ICD-10-CM | POA: Diagnosis not present

## 2020-02-16 DIAGNOSIS — C7989 Secondary malignant neoplasm of other specified sites: Secondary | ICD-10-CM | POA: Diagnosis not present

## 2020-02-16 DIAGNOSIS — R918 Other nonspecific abnormal finding of lung field: Secondary | ICD-10-CM | POA: Diagnosis not present

## 2020-02-16 DIAGNOSIS — C20 Malignant neoplasm of rectum: Secondary | ICD-10-CM | POA: Diagnosis not present

## 2020-02-16 DIAGNOSIS — Z418 Encounter for other procedures for purposes other than remedying health state: Secondary | ICD-10-CM | POA: Diagnosis not present

## 2020-02-16 DIAGNOSIS — C787 Secondary malignant neoplasm of liver and intrahepatic bile duct: Secondary | ICD-10-CM | POA: Diagnosis not present

## 2020-02-16 DIAGNOSIS — C19 Malignant neoplasm of rectosigmoid junction: Secondary | ICD-10-CM | POA: Diagnosis not present

## 2020-02-27 DIAGNOSIS — Z5111 Encounter for antineoplastic chemotherapy: Secondary | ICD-10-CM | POA: Diagnosis not present

## 2020-02-27 DIAGNOSIS — C787 Secondary malignant neoplasm of liver and intrahepatic bile duct: Secondary | ICD-10-CM | POA: Diagnosis not present

## 2020-02-27 DIAGNOSIS — C20 Malignant neoplasm of rectum: Secondary | ICD-10-CM | POA: Diagnosis not present

## 2020-02-28 DIAGNOSIS — C7989 Secondary malignant neoplasm of other specified sites: Secondary | ICD-10-CM | POA: Diagnosis not present

## 2020-02-28 DIAGNOSIS — C20 Malignant neoplasm of rectum: Secondary | ICD-10-CM | POA: Diagnosis not present

## 2020-02-28 DIAGNOSIS — Z5111 Encounter for antineoplastic chemotherapy: Secondary | ICD-10-CM | POA: Diagnosis not present

## 2020-02-28 DIAGNOSIS — C787 Secondary malignant neoplasm of liver and intrahepatic bile duct: Secondary | ICD-10-CM | POA: Diagnosis not present

## 2020-02-29 DIAGNOSIS — Z483 Aftercare following surgery for neoplasm: Secondary | ICD-10-CM | POA: Diagnosis not present

## 2020-02-29 DIAGNOSIS — C189 Malignant neoplasm of colon, unspecified: Secondary | ICD-10-CM | POA: Diagnosis not present

## 2020-03-01 DIAGNOSIS — C20 Malignant neoplasm of rectum: Secondary | ICD-10-CM | POA: Diagnosis not present

## 2020-03-01 DIAGNOSIS — C787 Secondary malignant neoplasm of liver and intrahepatic bile duct: Secondary | ICD-10-CM | POA: Diagnosis not present

## 2020-03-01 DIAGNOSIS — C7989 Secondary malignant neoplasm of other specified sites: Secondary | ICD-10-CM | POA: Diagnosis not present

## 2020-03-01 DIAGNOSIS — C19 Malignant neoplasm of rectosigmoid junction: Secondary | ICD-10-CM | POA: Diagnosis not present

## 2020-03-01 DIAGNOSIS — Z418 Encounter for other procedures for purposes other than remedying health state: Secondary | ICD-10-CM | POA: Diagnosis not present

## 2020-03-01 DIAGNOSIS — C7801 Secondary malignant neoplasm of right lung: Secondary | ICD-10-CM | POA: Diagnosis not present

## 2020-03-05 DIAGNOSIS — C787 Secondary malignant neoplasm of liver and intrahepatic bile duct: Secondary | ICD-10-CM | POA: Diagnosis not present

## 2020-03-05 DIAGNOSIS — Z5111 Encounter for antineoplastic chemotherapy: Secondary | ICD-10-CM | POA: Diagnosis not present

## 2020-03-05 DIAGNOSIS — C20 Malignant neoplasm of rectum: Secondary | ICD-10-CM | POA: Diagnosis not present

## 2020-03-06 DIAGNOSIS — C189 Malignant neoplasm of colon, unspecified: Secondary | ICD-10-CM | POA: Diagnosis not present

## 2020-03-06 DIAGNOSIS — Z483 Aftercare following surgery for neoplasm: Secondary | ICD-10-CM | POA: Diagnosis not present

## 2020-03-13 DIAGNOSIS — C7989 Secondary malignant neoplasm of other specified sites: Secondary | ICD-10-CM | POA: Diagnosis not present

## 2020-03-13 DIAGNOSIS — C787 Secondary malignant neoplasm of liver and intrahepatic bile duct: Secondary | ICD-10-CM | POA: Diagnosis not present

## 2020-03-13 DIAGNOSIS — Z5111 Encounter for antineoplastic chemotherapy: Secondary | ICD-10-CM | POA: Diagnosis not present

## 2020-03-13 DIAGNOSIS — C20 Malignant neoplasm of rectum: Secondary | ICD-10-CM | POA: Diagnosis not present

## 2020-03-15 DIAGNOSIS — C7801 Secondary malignant neoplasm of right lung: Secondary | ICD-10-CM | POA: Diagnosis not present

## 2020-03-15 DIAGNOSIS — C7989 Secondary malignant neoplasm of other specified sites: Secondary | ICD-10-CM | POA: Diagnosis not present

## 2020-03-15 DIAGNOSIS — C20 Malignant neoplasm of rectum: Secondary | ICD-10-CM | POA: Diagnosis not present

## 2020-03-15 DIAGNOSIS — Z418 Encounter for other procedures for purposes other than remedying health state: Secondary | ICD-10-CM | POA: Diagnosis not present

## 2020-03-15 DIAGNOSIS — C787 Secondary malignant neoplasm of liver and intrahepatic bile duct: Secondary | ICD-10-CM | POA: Diagnosis not present

## 2020-03-15 DIAGNOSIS — C19 Malignant neoplasm of rectosigmoid junction: Secondary | ICD-10-CM | POA: Diagnosis not present

## 2020-03-16 DIAGNOSIS — C189 Malignant neoplasm of colon, unspecified: Secondary | ICD-10-CM | POA: Diagnosis not present

## 2020-03-16 DIAGNOSIS — Z483 Aftercare following surgery for neoplasm: Secondary | ICD-10-CM | POA: Diagnosis not present

## 2020-03-20 DIAGNOSIS — Z932 Ileostomy status: Secondary | ICD-10-CM | POA: Diagnosis not present

## 2020-03-21 DIAGNOSIS — Z5111 Encounter for antineoplastic chemotherapy: Secondary | ICD-10-CM | POA: Diagnosis not present

## 2020-03-21 DIAGNOSIS — C20 Malignant neoplasm of rectum: Secondary | ICD-10-CM | POA: Diagnosis not present

## 2020-03-21 DIAGNOSIS — Z483 Aftercare following surgery for neoplasm: Secondary | ICD-10-CM | POA: Diagnosis not present

## 2020-03-21 DIAGNOSIS — C787 Secondary malignant neoplasm of liver and intrahepatic bile duct: Secondary | ICD-10-CM | POA: Diagnosis not present

## 2020-03-21 DIAGNOSIS — C189 Malignant neoplasm of colon, unspecified: Secondary | ICD-10-CM | POA: Diagnosis not present

## 2020-03-27 DIAGNOSIS — C20 Malignant neoplasm of rectum: Secondary | ICD-10-CM | POA: Diagnosis not present

## 2020-03-27 DIAGNOSIS — Z5111 Encounter for antineoplastic chemotherapy: Secondary | ICD-10-CM | POA: Diagnosis not present

## 2020-03-27 DIAGNOSIS — C7989 Secondary malignant neoplasm of other specified sites: Secondary | ICD-10-CM | POA: Diagnosis not present

## 2020-03-27 DIAGNOSIS — C787 Secondary malignant neoplasm of liver and intrahepatic bile duct: Secondary | ICD-10-CM | POA: Diagnosis not present

## 2020-03-29 DIAGNOSIS — C7989 Secondary malignant neoplasm of other specified sites: Secondary | ICD-10-CM | POA: Diagnosis not present

## 2020-03-29 DIAGNOSIS — C787 Secondary malignant neoplasm of liver and intrahepatic bile duct: Secondary | ICD-10-CM | POA: Diagnosis not present

## 2020-03-29 DIAGNOSIS — Z418 Encounter for other procedures for purposes other than remedying health state: Secondary | ICD-10-CM | POA: Diagnosis not present

## 2020-03-29 DIAGNOSIS — C19 Malignant neoplasm of rectosigmoid junction: Secondary | ICD-10-CM | POA: Diagnosis not present

## 2020-03-29 DIAGNOSIS — C20 Malignant neoplasm of rectum: Secondary | ICD-10-CM | POA: Diagnosis not present

## 2020-03-29 DIAGNOSIS — C7801 Secondary malignant neoplasm of right lung: Secondary | ICD-10-CM | POA: Diagnosis not present

## 2020-04-01 DIAGNOSIS — A419 Sepsis, unspecified organism: Secondary | ICD-10-CM | POA: Diagnosis not present

## 2020-04-01 DIAGNOSIS — R140 Abdominal distension (gaseous): Secondary | ICD-10-CM | POA: Diagnosis not present

## 2020-04-01 DIAGNOSIS — R509 Fever, unspecified: Secondary | ICD-10-CM | POA: Diagnosis not present

## 2020-04-01 DIAGNOSIS — C7800 Secondary malignant neoplasm of unspecified lung: Secondary | ICD-10-CM | POA: Diagnosis not present

## 2020-04-01 DIAGNOSIS — C787 Secondary malignant neoplasm of liver and intrahepatic bile duct: Secondary | ICD-10-CM | POA: Diagnosis not present

## 2020-04-01 DIAGNOSIS — R195 Other fecal abnormalities: Secondary | ICD-10-CM | POA: Diagnosis not present

## 2020-04-01 DIAGNOSIS — D649 Anemia, unspecified: Secondary | ICD-10-CM | POA: Diagnosis not present

## 2020-04-01 DIAGNOSIS — C20 Malignant neoplasm of rectum: Secondary | ICD-10-CM | POA: Diagnosis not present

## 2020-04-01 DIAGNOSIS — R7401 Elevation of levels of liver transaminase levels: Secondary | ICD-10-CM | POA: Diagnosis not present

## 2020-04-01 DIAGNOSIS — D6959 Other secondary thrombocytopenia: Secondary | ICD-10-CM | POA: Diagnosis not present

## 2020-04-01 DIAGNOSIS — E43 Unspecified severe protein-calorie malnutrition: Secondary | ICD-10-CM | POA: Diagnosis not present

## 2020-04-01 DIAGNOSIS — C19 Malignant neoplasm of rectosigmoid junction: Secondary | ICD-10-CM | POA: Diagnosis not present

## 2020-04-01 DIAGNOSIS — D72829 Elevated white blood cell count, unspecified: Secondary | ICD-10-CM | POA: Diagnosis not present

## 2020-04-01 DIAGNOSIS — R188 Other ascites: Secondary | ICD-10-CM | POA: Diagnosis not present

## 2020-04-01 DIAGNOSIS — Z20822 Contact with and (suspected) exposure to covid-19: Secondary | ICD-10-CM | POA: Diagnosis not present

## 2020-04-01 DIAGNOSIS — Z66 Do not resuscitate: Secondary | ICD-10-CM | POA: Diagnosis not present

## 2020-04-01 DIAGNOSIS — Z79899 Other long term (current) drug therapy: Secondary | ICD-10-CM | POA: Diagnosis not present

## 2020-04-01 DIAGNOSIS — R109 Unspecified abdominal pain: Secondary | ICD-10-CM | POA: Diagnosis not present

## 2020-04-01 DIAGNOSIS — D696 Thrombocytopenia, unspecified: Secondary | ICD-10-CM | POA: Diagnosis not present

## 2020-04-01 DIAGNOSIS — R180 Malignant ascites: Secondary | ICD-10-CM | POA: Diagnosis not present

## 2020-04-01 DIAGNOSIS — K5900 Constipation, unspecified: Secondary | ICD-10-CM | POA: Diagnosis not present

## 2020-04-01 DIAGNOSIS — Z95828 Presence of other vascular implants and grafts: Secondary | ICD-10-CM | POA: Diagnosis not present

## 2020-04-01 DIAGNOSIS — K652 Spontaneous bacterial peritonitis: Secondary | ICD-10-CM | POA: Diagnosis not present

## 2020-04-01 DIAGNOSIS — C786 Secondary malignant neoplasm of retroperitoneum and peritoneum: Secondary | ICD-10-CM | POA: Diagnosis not present

## 2020-04-05 ENCOUNTER — Other Ambulatory Visit: Payer: Self-pay | Admitting: *Deleted

## 2020-04-05 NOTE — Patient Outreach (Signed)
[facility]) Care Management  04/05/2020  Sharon Harris 03-Mar-1965 [id number]  Transition of care telephone call  Referral received:04/03/20 Initial outreach:04/05/20 Insurance: [facility]   Initial unsuccessful telephone call to patient's preferred number in order to complete transition of care assessment; no answer, left HIPAA compliant voicemail message requesting return call.   Objective: Per the electronic medical record, inbasket message, Sharon Harris  was hospitalized at [facility] 04/01/20 for Dx listed as Ascites. Reviewed patient ping no information on discharge, call to Hospital on 3/25 patient not listed as inpatient . Marland Kitchen  Plan: This RNCM will route unsuccessful outreach letter with [place] Management pamphlet and 24 hour Nurse Advice Line Magnet to [place] Management clinical pool to be mailed to patient's home address. This RNCM will attempt another outreach within 4 business days.  Joylene Draft, RN, BSN  [place] Management Coordinator  [phone]- Mobile [phone]- Toll Free Main Office

## 2020-04-09 DIAGNOSIS — Z933 Colostomy status: Secondary | ICD-10-CM | POA: Diagnosis not present

## 2020-04-09 DIAGNOSIS — C787 Secondary malignant neoplasm of liver and intrahepatic bile duct: Secondary | ICD-10-CM | POA: Diagnosis not present

## 2020-04-09 DIAGNOSIS — Z5111 Encounter for antineoplastic chemotherapy: Secondary | ICD-10-CM | POA: Diagnosis not present

## 2020-04-09 DIAGNOSIS — C786 Secondary malignant neoplasm of retroperitoneum and peritoneum: Secondary | ICD-10-CM | POA: Diagnosis not present

## 2020-04-09 DIAGNOSIS — C20 Malignant neoplasm of rectum: Secondary | ICD-10-CM | POA: Diagnosis not present

## 2020-04-10 ENCOUNTER — Encounter: Payer: Self-pay | Admitting: *Deleted

## 2020-04-10 ENCOUNTER — Other Ambulatory Visit: Payer: Self-pay | Admitting: *Deleted

## 2020-04-10 DIAGNOSIS — R188 Other ascites: Secondary | ICD-10-CM | POA: Insufficient documentation

## 2020-04-10 NOTE — Patient Outreach (Signed)
[facility]) Care Management  04/10/2020  Sharon Harris May 02, 1965 [id number]   Transition of care call/case closure   Referral received: 04/03/20 Initial outreach:04/05/20 Insurance: [employer]    Subjective: 2nd attempt  successful telephone call to patient's preferred number in order to complete transition of care assessment; 2 HIPAA identifiers verified. Explained purpose of call and completed transition of care assessment.  Sharon Harris states that she is doing okay, but she can tell fluid in abdomen is starting to build back up. She discussed recent hospital admission for Ascites and paracentesis with 800 ml. Obtained. She reports having a small body frame and it doesn't take much for her to start to fill uncomfortable if fluid builds up. Reinforced notifying providers sooner of concerns related to increase in abdominal size, pain and increase discomfort. She reports pain managed at this time with prescribed medication. She discussed plans to discontinue current chemotherapy that is not helping with progression of disease .  Marland KitchenShe discussed plan to begin another oral form of chemotherapy, awaiting to receive medication through mail and she will contact provider regarding how to initiate.   She reports that she is managing colostomy care. She discussed decreased appetite and intake,eating small meals and including protein shakes.  Spouse/children are assisting with her recovery.   .    Objective:  [name]  was hospitalized at [facility] 04/01/20-04/03/20  for Dx listed as Ascites, s/p paracentesis. PMHX; Metastatic rectal adenocarcinoma , lung metastasis, Colostomy 01/26/20 Assessment:  Patient voices good understanding of all discharge instructions.  See transition of care flowsheet for assessment details.   Plan:  Reviewed hospital discharge diagnosis of Ascites    and discharge treatment plan using hospital discharge instructions,  assessing medication adherence, reviewing problems requiring provider notification, and discussing the importance of follow up with surgeon, primary care provider and/or specialists as directed.  Patient agreeable to plan for follow up call in the next 2 weeks for assessment of care / resource needs.    Joylene Draft, RN, BSN  [place] Management Coordinator  [phone]- Mobile [phone]- Toll Free Main Office

## 2020-04-15 DIAGNOSIS — Z20822 Contact with and (suspected) exposure to covid-19: Secondary | ICD-10-CM | POA: Diagnosis not present

## 2020-04-15 DIAGNOSIS — Z66 Do not resuscitate: Secondary | ICD-10-CM | POA: Diagnosis not present

## 2020-04-15 DIAGNOSIS — R188 Other ascites: Secondary | ICD-10-CM | POA: Diagnosis not present

## 2020-04-15 DIAGNOSIS — Z95828 Presence of other vascular implants and grafts: Secondary | ICD-10-CM | POA: Diagnosis not present

## 2020-04-15 DIAGNOSIS — I517 Cardiomegaly: Secondary | ICD-10-CM | POA: Diagnosis not present

## 2020-04-15 DIAGNOSIS — Z9889 Other specified postprocedural states: Secondary | ICD-10-CM | POA: Diagnosis not present

## 2020-04-15 DIAGNOSIS — R0602 Shortness of breath: Secondary | ICD-10-CM | POA: Diagnosis not present

## 2020-04-15 DIAGNOSIS — R195 Other fecal abnormalities: Secondary | ICD-10-CM | POA: Diagnosis not present

## 2020-04-15 DIAGNOSIS — C19 Malignant neoplasm of rectosigmoid junction: Secondary | ICD-10-CM | POA: Diagnosis not present

## 2020-04-15 DIAGNOSIS — R109 Unspecified abdominal pain: Secondary | ICD-10-CM | POA: Diagnosis not present

## 2020-04-15 DIAGNOSIS — Z933 Colostomy status: Secondary | ICD-10-CM | POA: Diagnosis not present

## 2020-04-15 DIAGNOSIS — C787 Secondary malignant neoplasm of liver and intrahepatic bile duct: Secondary | ICD-10-CM | POA: Diagnosis not present

## 2020-04-15 DIAGNOSIS — C7800 Secondary malignant neoplasm of unspecified lung: Secondary | ICD-10-CM | POA: Diagnosis not present

## 2020-04-15 DIAGNOSIS — Z5329 Procedure and treatment not carried out because of patient's decision for other reasons: Secondary | ICD-10-CM | POA: Diagnosis not present

## 2020-04-15 DIAGNOSIS — R140 Abdominal distension (gaseous): Secondary | ICD-10-CM | POA: Diagnosis not present

## 2020-04-15 DIAGNOSIS — R8569 Abnormal cytological findings in specimens from other digestive organs and abdominal cavity: Secondary | ICD-10-CM | POA: Diagnosis not present

## 2020-04-15 DIAGNOSIS — I471 Supraventricular tachycardia: Secondary | ICD-10-CM | POA: Diagnosis not present

## 2020-04-15 DIAGNOSIS — A419 Sepsis, unspecified organism: Secondary | ICD-10-CM | POA: Diagnosis not present

## 2020-04-15 DIAGNOSIS — R9431 Abnormal electrocardiogram [ECG] [EKG]: Secondary | ICD-10-CM | POA: Diagnosis not present

## 2020-04-15 DIAGNOSIS — K652 Spontaneous bacterial peritonitis: Secondary | ICD-10-CM | POA: Diagnosis not present

## 2020-04-15 DIAGNOSIS — R7401 Elevation of levels of liver transaminase levels: Secondary | ICD-10-CM | POA: Diagnosis not present

## 2020-04-16 DIAGNOSIS — E539 Vitamin B deficiency, unspecified: Secondary | ICD-10-CM | POA: Diagnosis not present

## 2020-04-16 DIAGNOSIS — F4323 Adjustment disorder with mixed anxiety and depressed mood: Secondary | ICD-10-CM | POA: Diagnosis not present

## 2020-04-16 DIAGNOSIS — C20 Malignant neoplasm of rectum: Secondary | ICD-10-CM | POA: Diagnosis not present

## 2020-04-19 ENCOUNTER — Other Ambulatory Visit (HOSPITAL_COMMUNITY): Payer: Self-pay

## 2020-04-19 MED ORDER — LONSURF 20-8.19 MG PO TABS: ORAL_TABLET | ORAL | 5 refills | Status: AC

## 2020-04-20 ENCOUNTER — Other Ambulatory Visit (HOSPITAL_COMMUNITY): Payer: Self-pay

## 2020-04-22 ENCOUNTER — Other Ambulatory Visit (HOSPITAL_COMMUNITY): Payer: Self-pay

## 2020-04-22 DIAGNOSIS — C20 Malignant neoplasm of rectum: Secondary | ICD-10-CM | POA: Diagnosis not present

## 2020-04-23 ENCOUNTER — Other Ambulatory Visit: Payer: Self-pay | Admitting: *Deleted

## 2020-04-23 NOTE — Patient Outreach (Signed)
[facility]) Care Management  04/23/2020  Amariana Mirando November 10, 1965 [id number]   Transition of care /follow up call   Referral received: 04/03/20 Initial outreach:04/05/20 Insurance: [employer]    Subjective Unsuccessful transition of care  follow up outreach call to patient no answer able to leave a HIPAA compliant voicemail message for return call.    Objective [name] hospitalized at [facility] 04/01/20-04/03/20  for Dx listed as Ascites, s/p paracentesis. PMHX; Metastatic rectal adenocarcinoma , lung metastasis, Colostomy 01/26/20  Plan If no return call , will attempt outreach call in the next week.    Joylene Draft, RN, BSN  [place] Management Coordinator  [phone]- Mobile [phone]- Toll Free Main Office

## 2020-04-25 DIAGNOSIS — R188 Other ascites: Secondary | ICD-10-CM | POA: Diagnosis not present

## 2020-04-30 DIAGNOSIS — C20 Malignant neoplasm of rectum: Secondary | ICD-10-CM | POA: Diagnosis not present

## 2020-05-01 ENCOUNTER — Other Ambulatory Visit: Payer: Self-pay | Admitting: *Deleted

## 2020-05-01 DIAGNOSIS — C20 Malignant neoplasm of rectum: Secondary | ICD-10-CM | POA: Diagnosis not present

## 2020-05-01 NOTE — Patient Outreach (Signed)
[facility]) Care Management  05/01/2020  Sharon Harris 03/17/1965 [id number]    Transition of care /follow up call/Case Closure     Referral received:04/03/20 Initial outreach:04/05/20 Insurance: [employer]   Subjective Follow up outreach call to patient, per person answering phone identified as patient sister Sharon Harris is caring for patient , HIPAA identity verified.Explained reason for the follow up call.  She reports that patient is resting after taking medication, she states patient is had recent paracentesis that did not require hospitalization.  She discussed patient plan is supportive care with Palliative care consult planned for today and no longer on chemotherapy.     Objective [name] hospitalized at [facility] 04/01/20-3/23/22for Dx listed as Ascites, s/p paracentesis. PMHX; Metastatic rectal adenocarcinoma , lung metastasis, Colostomy 01/26/20.  Plan Will plan case closure as patient will be enrolled in external program.    Joylene Draft, RN, BSN  [place] Management Coordinator  [phone]- Mobile [phone]- [place]

## 2020-05-02 DIAGNOSIS — C20 Malignant neoplasm of rectum: Secondary | ICD-10-CM | POA: Diagnosis not present

## 2020-05-03 DIAGNOSIS — C20 Malignant neoplasm of rectum: Secondary | ICD-10-CM | POA: Diagnosis not present

## 2020-05-12 DEATH — deceased

## 2021-04-30 IMAGING — CT CT ABD-PELV W/ CM
3 series · 14 of 32 positions shown, 17 images · IV contrast (APPLIED)
Comparison: MR abdomen, 01/25/2019, CT abdomen pelvis, 11/09/2018

CLINICAL DATA: Metastatic rectal cancer

EXAM:
CT CHEST, ABDOMEN, AND PELVIS WITH CONTRAST
TECHNIQUE: Multidetector CT imaging of the chest, abdomen and pelvis was
performed following the standard protocol during bolus
administration of intravenous contrast.
CONTRAST:  100mL [id number] IOPAMIDOL (Z0GKMZ-[id number]) INJECTION 61%,
additional oral enteric contrast

[Series 2: chest/abd/pelvis w/cm · axial · 0.70mm/px · z∈[-635,-90]mm · 8 of 131 slices shown]
[im 11/131  soft-tissue]
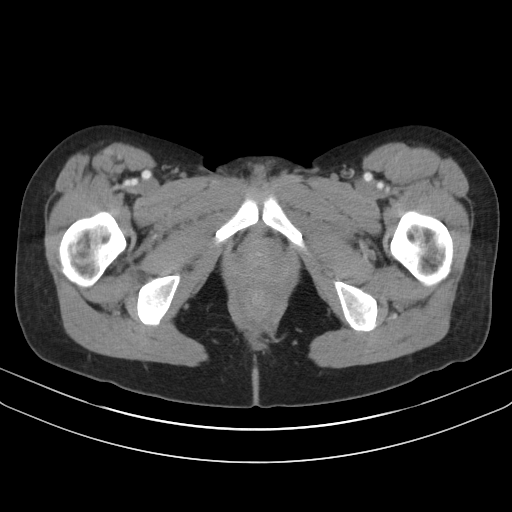
[im 33/131  soft-tissue]
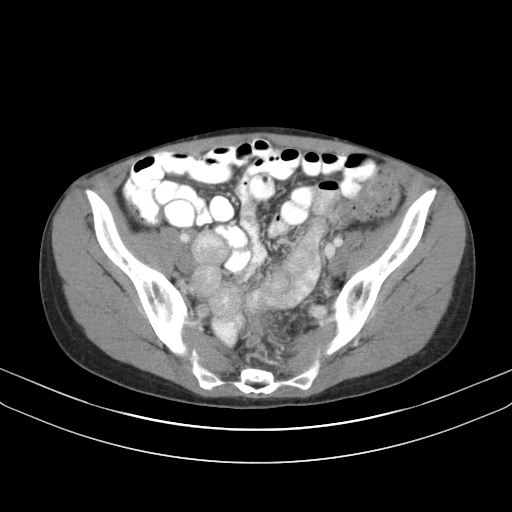
[im 44/131  soft-tissue]
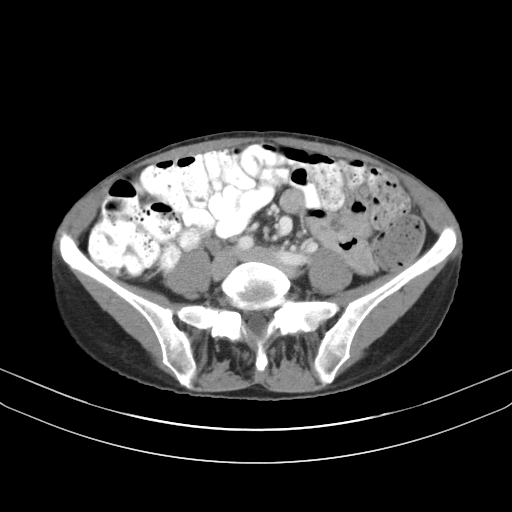
[im 55/131  soft-tissue]
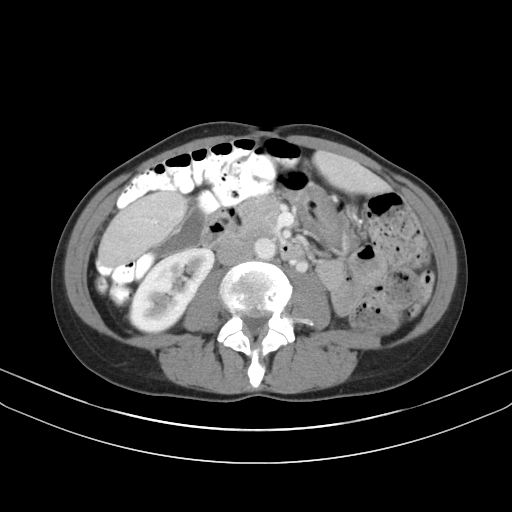
[im 76/131  soft-tissue]
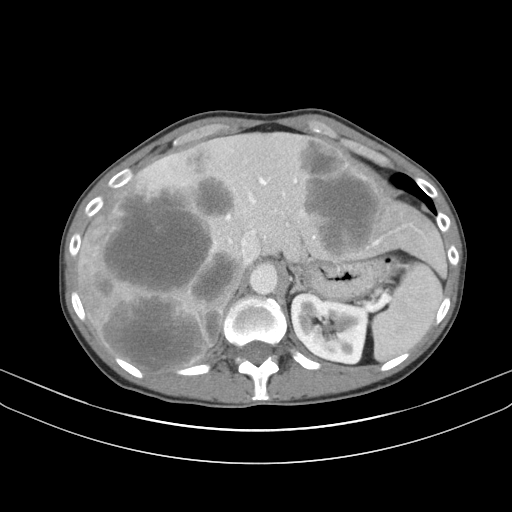
[im 87/131  soft-tissue]
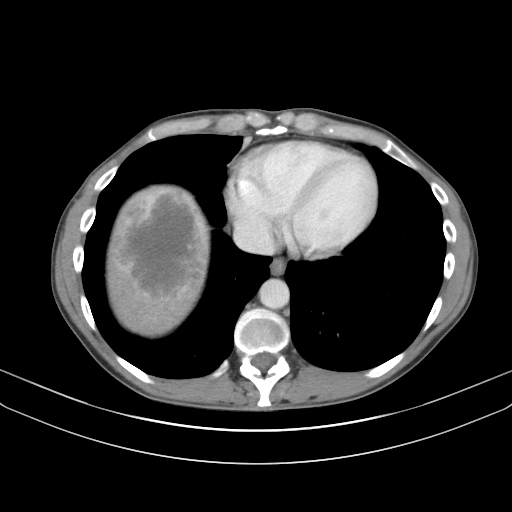
[im 98/131  soft-tissue]
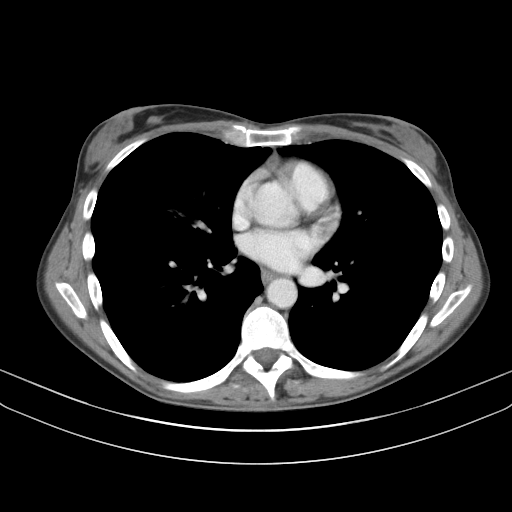
[im 120/131  soft-tissue]
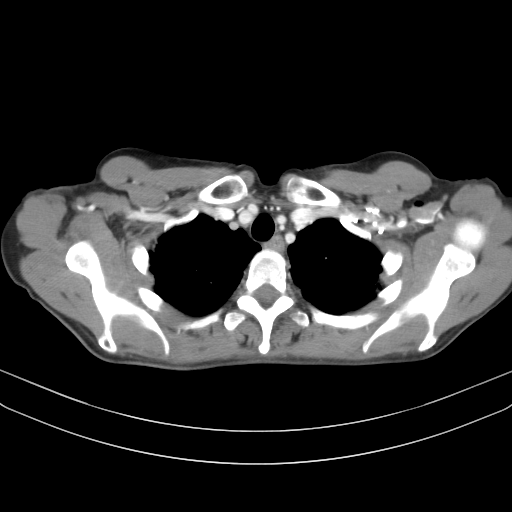

[Series 4: lung · axial · 0.62mm/px · z∈[-327,-215]mm · 4 of 158 slices shown]
[im 12/158  bone]
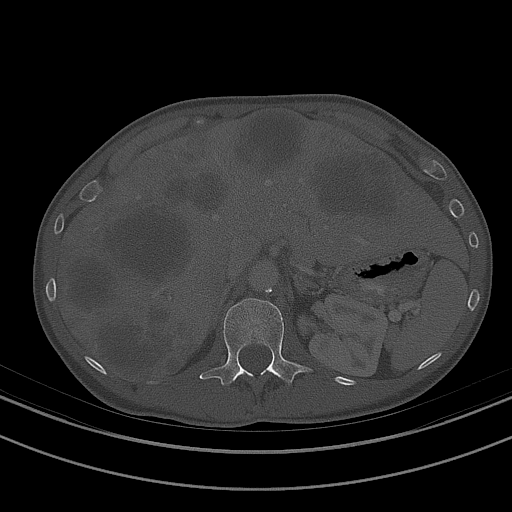
[im 34/158  bone]
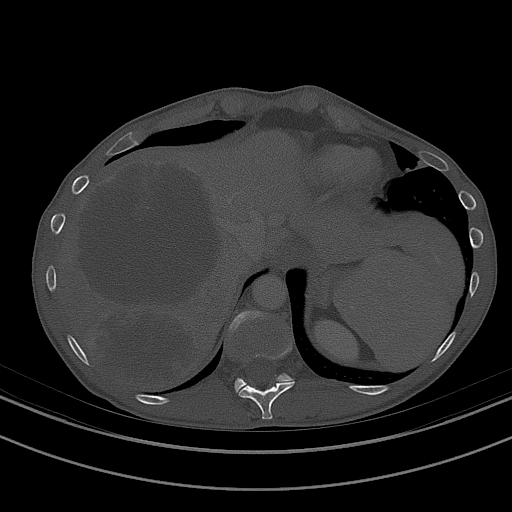
[im 57/158  bone]
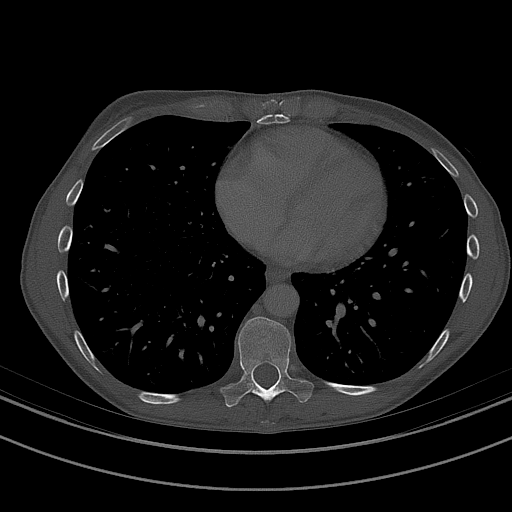
[im 68/158  bone]
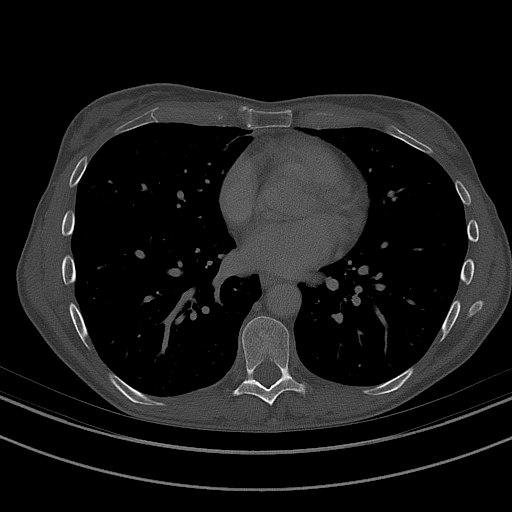

[Series 7: renal delay · axial · delayed · 0.70mm/px · z∈[-377,-312]mm · 2 of 41 slices shown, 5 images]
[im 14/41  soft-tissue]
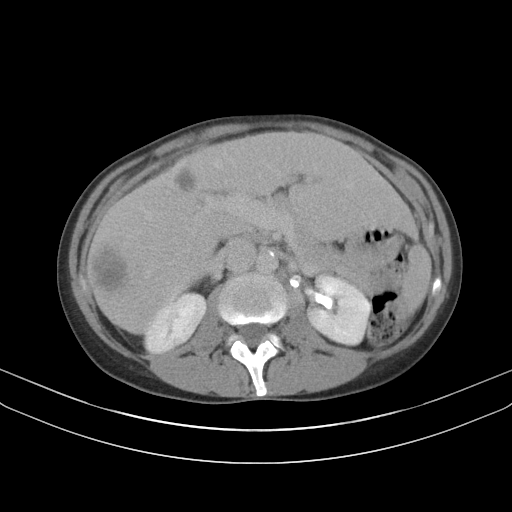
[im 14/41  lung]
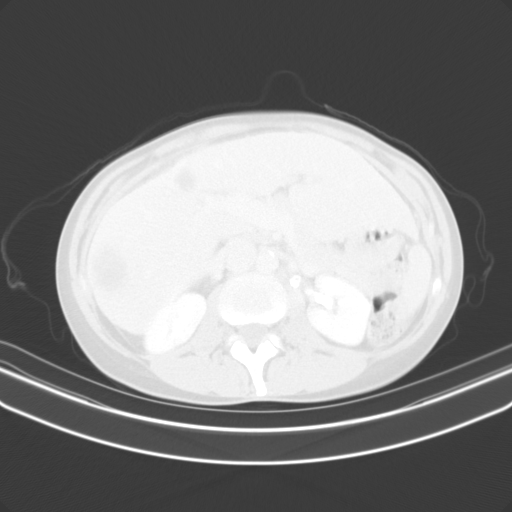
[im 14/41  bone]
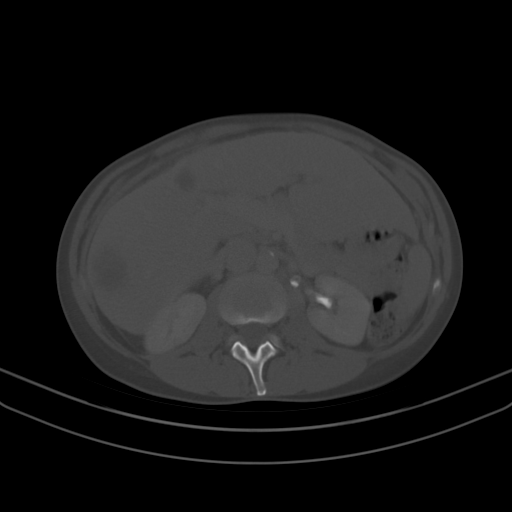
[im 27/41  soft-tissue]
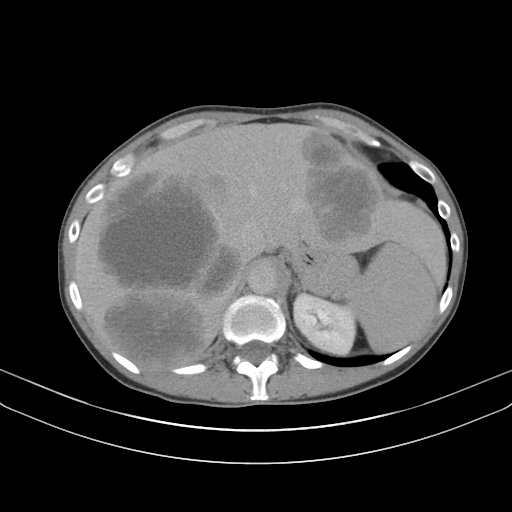
[im 27/41  lung]
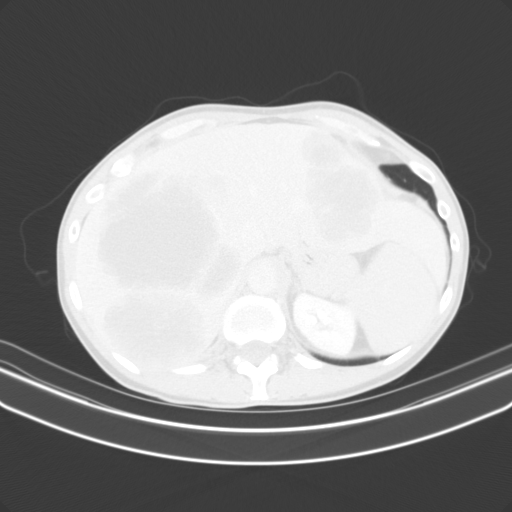

[14 of 32 positions shown; findings below may reference images not displayed]

FINDINGS: CT CHEST FINDINGS

Cardiovascular: No significant vascular findings. Normal heart size.
No pericardial effusion.

Mediastinum/Nodes: No enlarged mediastinal, hilar, or axillary lymph
nodes. Thyroid gland, trachea, and esophagus demonstrate no
significant findings.

Lungs/Pleura: Biapical pleuroparenchymal scarring and multiple small
nodules of the bilateral lung apices, the largest a 6 mm pulmonary
nodule of the right apex (series 4, image 36). There is a 7 mm
subpleural nodule of the anterior right middle lobe, significantly
enlarged compared to prior examination, previously 3 mm (series 4,
image 114). No pleural effusion or pneumothorax.

Musculoskeletal: No chest wall mass or suspicious bone lesions
identified.

CT ABDOMEN PELVIS FINDINGS

Hepatobiliary: Multiple bulky hypodense, rim enhancing masses of the
liver, which are in keeping with findings of recent prior MRI and
greatly enlarged compared to prior CT dated 11/09/2018. The largest
index mass of the central right lobe of the liver measures 9.6 x
cm, previously 4.2 x 3.9 cm (series 2, image 50). No gallstones,
gallbladder wall thickening, or biliary dilatation.

Pancreas: Unremarkable. No pancreatic ductal dilatation or
surrounding inflammatory changes.

Spleen: Normal in size without significant abnormality.

Adrenals/Urinary Tract: Adrenal glands are unremarkable. Kidneys are
normal, without renal calculi, solid lesion, or hydronephrosis.
Bladder is unremarkable.

Stomach/Bowel: Stomach is within normal limits. There is
circumferential thickening of the rectum, which appears increased
compared to prior examination (series 2, image 25). Large burden of
stool throughout the colon.

Vascular/Lymphatic: Aortic atherosclerosis. There is a soft tissue
nodule adjacent to the superior rectum measuring 1.5 cm, enlarged
compared to prior examination, previously 1.0 cm (series 2, image
97, series 5, image 101).

Reproductive: No mass or other abnormality.

Other: No abdominal wall hernia or abnormality. No abdominopelvic
ascites.

Musculoskeletal: No acute or significant osseous findings.
IMPRESSION: 1. Multiple bulky hypodense, rim enhancing masses of the liver,
which are in keeping with findings of recent prior MRI and greatly
enlarged compared to prior CT dated 11/09/2018. The largest index
mass of the central right lobe of the liver measures 9.6 x 9.1 cm,
previously 4.2 x 3.9 cm (series 2, image 50). Findings are
consistent with hepatic metastatic disease.
2. There is circumferential thickening of the rectum, which appears
increased compared to prior examination (series 2, image 25), in
keeping with primary rectal malignancy. There is a soft tissue
nodule adjacent to the superior rectum measuring 1.5 cm, enlarged
compared to prior examination, previously 1.0 cm (series 2, image
97, series 5, image 101), consistent with perirectal nodal
metastatic disease.
3. There is a 7 mm subpleural nodule of the anterior right middle
lobe, significantly enlarged compared to prior examination,
previously 3 mm (series 4, image 114). This is presumably pulmonary
metastatic disease.
4. Biapical pleuroparenchymal scarring and multiple small nodules of
the bilateral lung apices, the largest a 6 mm pulmonary nodule of
the right apex (series 4, image 36). These findings are likely
sequelae of prior infection or inflammation. Attention on follow-up.
5.  Aortic Atherosclerosis ([id number]).
